# Patient Record
Sex: Male | Born: 1980 | Hispanic: No | Marital: Single
Health system: Southern US, Community
[De-identification: ages and names within clinical notes are randomized; demographics above are authoritative.]

## PROBLEM LIST (undated history)

## (undated) ENCOUNTER — Ambulatory Visit (HOSPITAL_COMMUNITY): Admission: EM | Payer: Medicaid Other | Source: Home / Self Care

---

## 2002-04-03 ENCOUNTER — Emergency Department (HOSPITAL_COMMUNITY): Admission: EM | Admit: 2002-04-03 | Discharge: 2002-04-03 | Payer: Self-pay | Admitting: Emergency Medicine

## 2003-04-09 ENCOUNTER — Emergency Department (HOSPITAL_COMMUNITY): Admission: EM | Admit: 2003-04-09 | Discharge: 2003-04-09 | Payer: Self-pay | Admitting: Emergency Medicine

## 2003-12-28 ENCOUNTER — Emergency Department (HOSPITAL_COMMUNITY): Admission: EM | Admit: 2003-12-28 | Discharge: 2003-12-28 | Payer: Self-pay | Admitting: Emergency Medicine

## 2005-05-31 ENCOUNTER — Emergency Department (HOSPITAL_COMMUNITY): Admission: EM | Admit: 2005-05-31 | Discharge: 2005-05-31 | Payer: Self-pay | Admitting: Emergency Medicine

## 2011-07-14 ENCOUNTER — Emergency Department (HOSPITAL_COMMUNITY)
Admission: EM | Admit: 2011-07-14 | Discharge: 2011-07-14 | Disposition: A | Discharge status: Home / Self Care | Payer: Self-pay | Attending: Emergency Medicine | Admitting: Emergency Medicine

## 2011-07-14 ENCOUNTER — Emergency Department (HOSPITAL_COMMUNITY): Payer: Self-pay

## 2011-07-14 ENCOUNTER — Encounter (HOSPITAL_COMMUNITY): Payer: Self-pay

## 2011-07-14 DIAGNOSIS — R1031 Right lower quadrant pain: Secondary | ICD-10-CM | POA: Insufficient documentation

## 2011-07-14 LAB — POCT I-STAT, CHEM 8
BUN: 14 mg/dL (ref 6–23)
Chloride: 105 mEq/L (ref 96–112)
HCT: 48 % (ref 39.0–52.0)
Potassium: 4.3 mEq/L (ref 3.5–5.1)
Sodium: 140 mEq/L (ref 135–145)

## 2011-07-14 LAB — URINALYSIS, ROUTINE W REFLEX MICROSCOPIC
Bilirubin Urine: NEGATIVE
Hgb urine dipstick: NEGATIVE
Nitrite: NEGATIVE
Specific Gravity, Urine: 1.008 (ref 1.005–1.030)
Urobilinogen, UA: 0.2 mg/dL (ref 0.0–1.0)
pH: 6 (ref 5.0–8.0)

## 2011-07-14 LAB — CBC
HCT: 43.6 % (ref 39.0–52.0)
MCV: 75.2 fL — ABNORMAL LOW (ref 78.0–100.0)
RBC: 5.8 MIL/uL (ref 4.22–5.81)
WBC: 7.3 10*3/uL (ref 4.0–10.5)

## 2011-07-14 MED ORDER — IOHEXOL 300 MG/ML  SOLN
100.0000 mL | Freq: Once | INTRAMUSCULAR | Status: AC | PRN
  Administered 2011-07-14: 100 mL via INTRAVENOUS

## 2012-11-24 IMAGING — CT CT ABD-PELV W/ CM
2 of 4 series · 17 of 46 positions shown, 19 images · IV contrast (water/omni  & 100ml omni 300)
Comparison: None.

CLINICAL DATA: Right upper quadrant abdominal pain.

CT ABDOMEN AND PELVIS WITH CONTRAST
TECHNIQUE: Multidetector CT imaging of the abdomen and pelvis was
performed following the standard protocol during bolus
administration of intravenous contrast.
Contrast: 100mL OMNIPAQUE IOHEXOL 300 MG/ML IV SOLN

[Series 2: routine abdomen · axial · 0.81mm/px · z∈[-538,-158]mm · 14 of 84 slices shown, 16 images]
[im 4/84  soft-tissue]
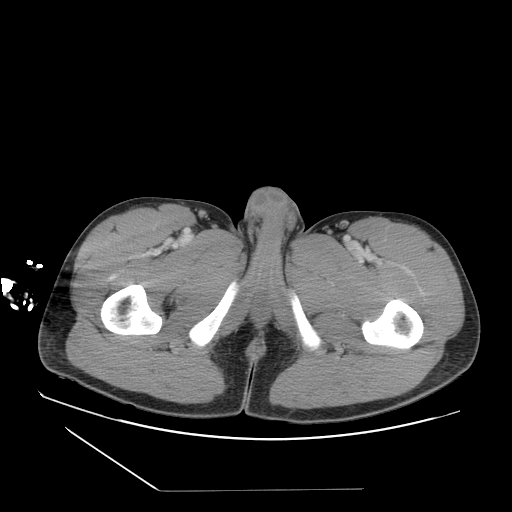
[im 4/84  bone]
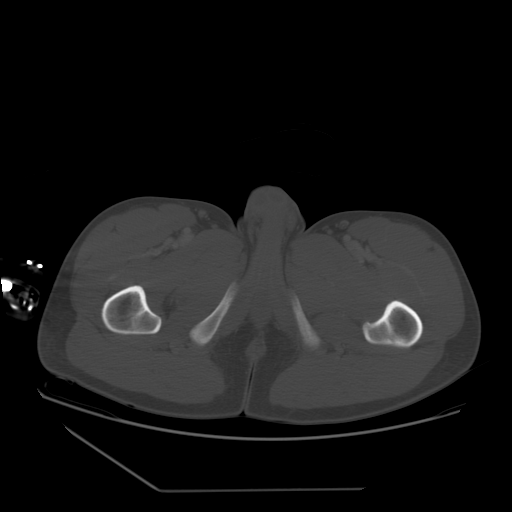
[im 11/84  soft-tissue]
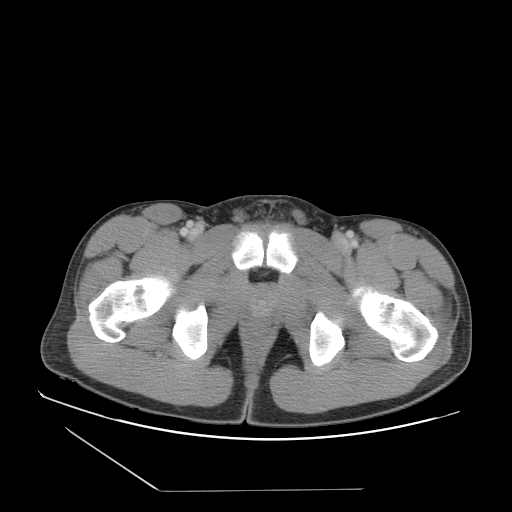
[im 18/84  soft-tissue]
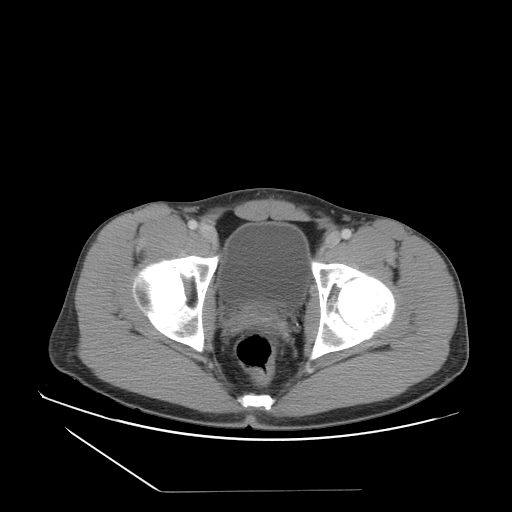
[im 21/84  soft-tissue]
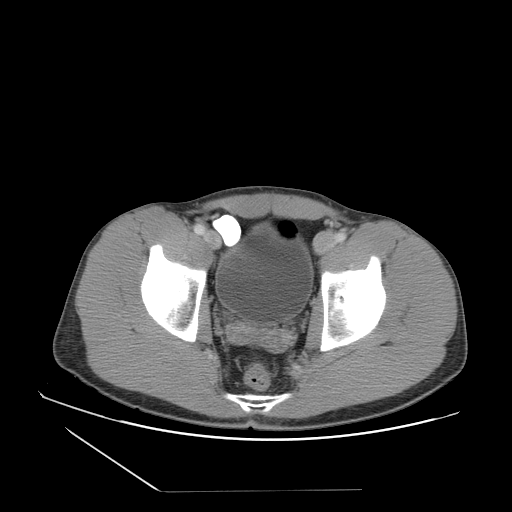
[im 28/84  soft-tissue]
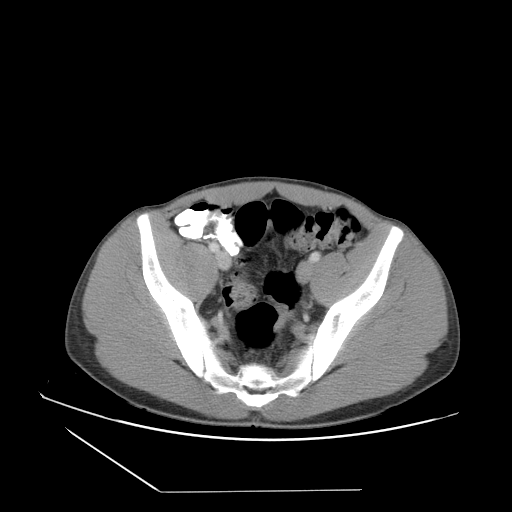
[im 35/84  soft-tissue]
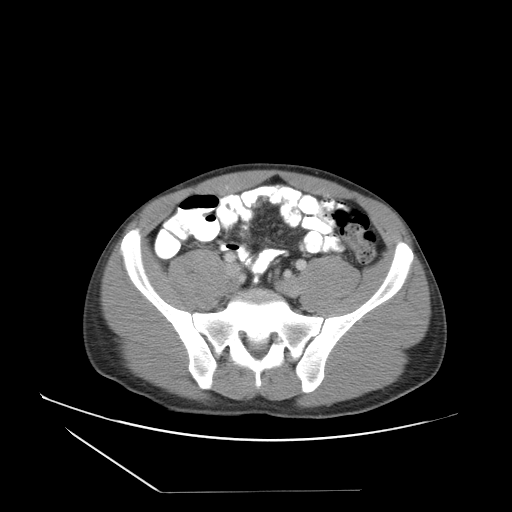
[im 39/84  soft-tissue]
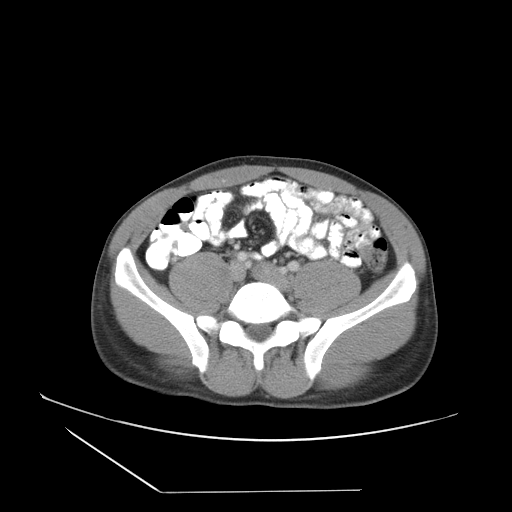
[im 45/84  soft-tissue]
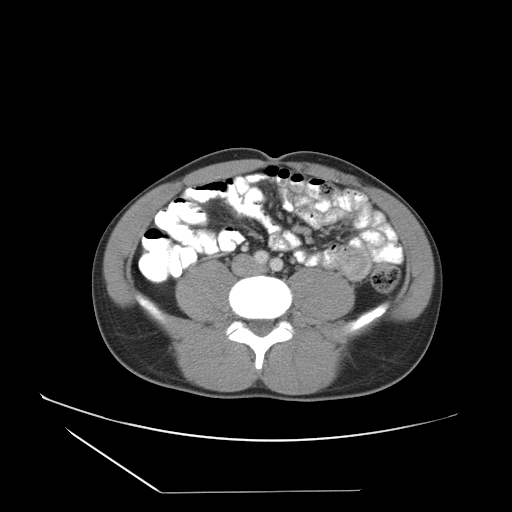
[im 49/84  soft-tissue]
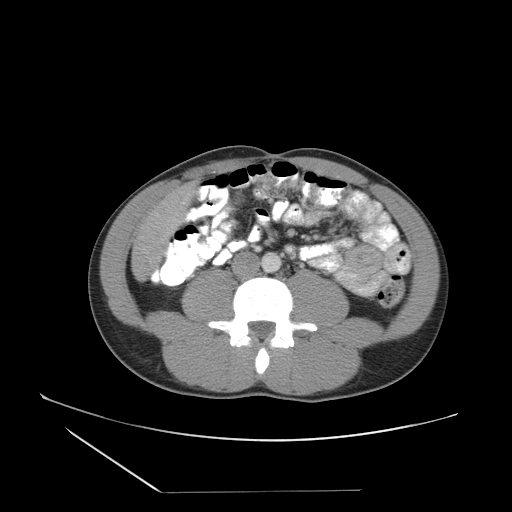
[im 49/84  bone]
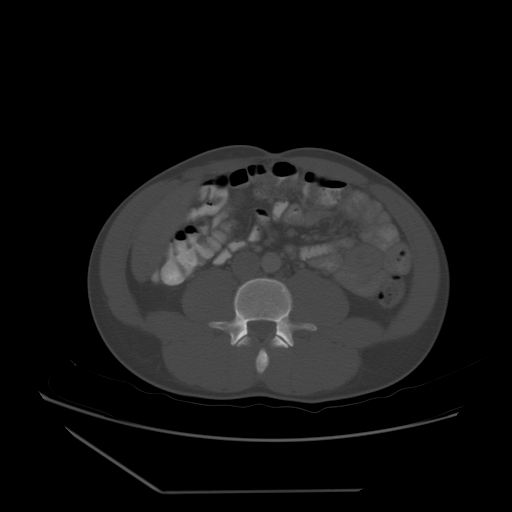
[im 56/84  soft-tissue]
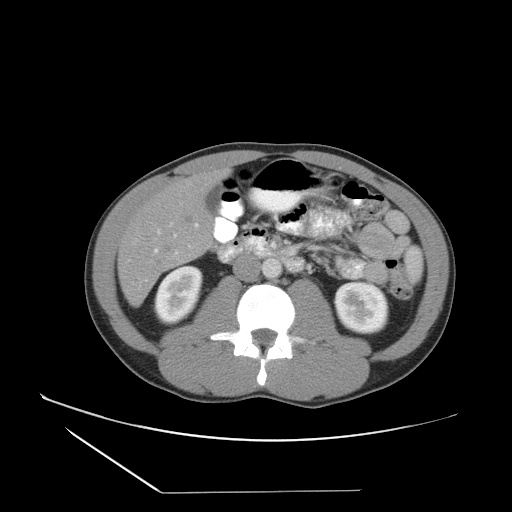
[im 63/84  soft-tissue]
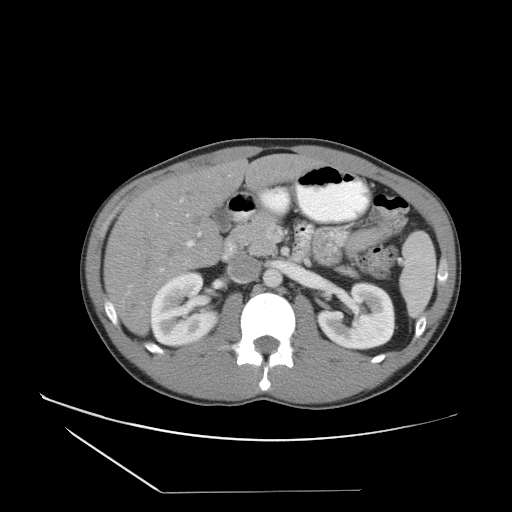
[im 66/84  soft-tissue]
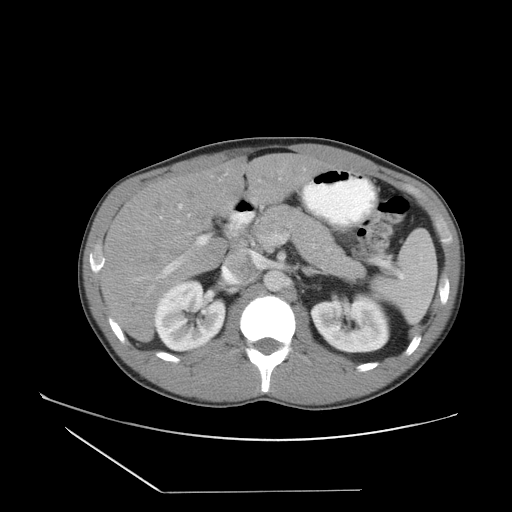
[im 73/84  soft-tissue]
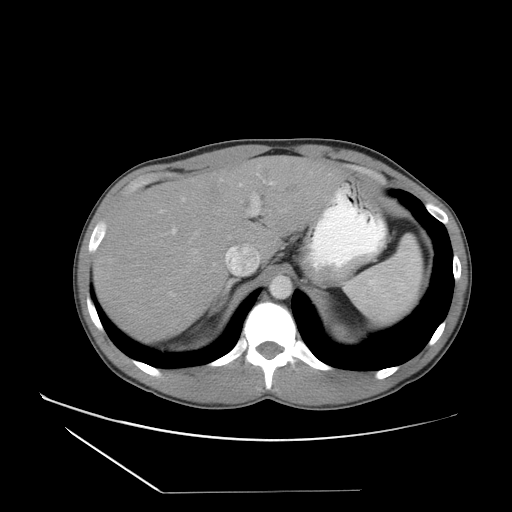
[im 80/84  soft-tissue]
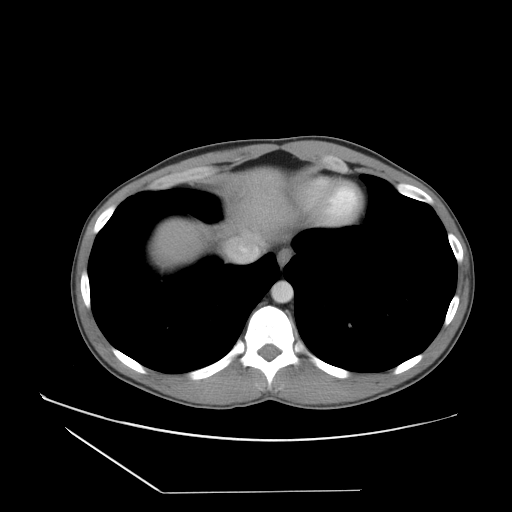

[Series 401: coronal · coronal · 0.83mm/px · 3 of 79 slices shown]
[im 27/79  soft-tissue]
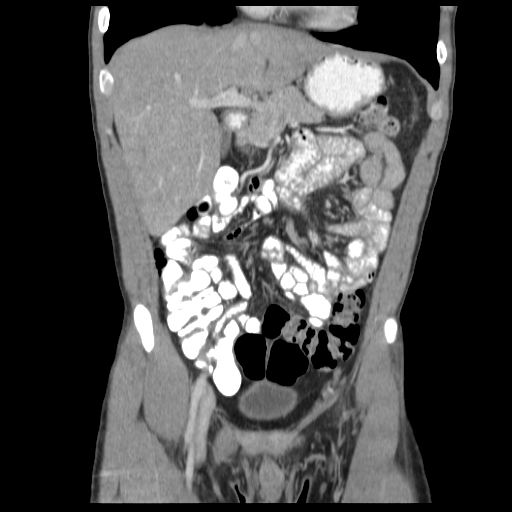
[im 35/79  soft-tissue]
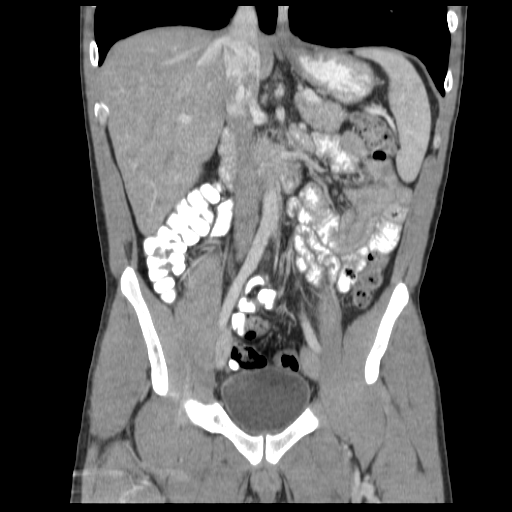
[im 44/79  soft-tissue]
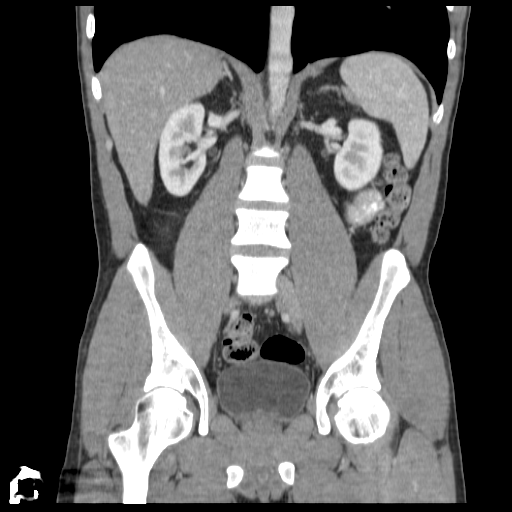

[17 of 46 positions shown; findings below may reference images not displayed]

FINDINGS: Lung bases are clear.  No pleural or pericardial
effusion.

The gallbladder, liver, spleen, adrenal glands, pancreas and
kidneys all appear normal.  The appendix is well visualized and
normal in appearance.  There is no lymphadenopathy or fluid.
Urinary bladder appears normal.  The stomach and small and large
bowel normal in appearance.  There is no focal bony abnormality.
IMPRESSION: Negative for appendicitis.  Normal study.

## 2019-03-24 ENCOUNTER — Other Ambulatory Visit: Payer: Self-pay

## 2019-03-24 ENCOUNTER — Emergency Department (HOSPITAL_COMMUNITY): Payer: Self-pay

## 2019-03-24 ENCOUNTER — Emergency Department (HOSPITAL_COMMUNITY)
Admission: EM | Admit: 2019-03-24 | Discharge: 2019-03-25 | Disposition: A | Discharge status: Home / Self Care | Payer: Self-pay | Attending: Emergency Medicine | Admitting: Emergency Medicine

## 2019-03-24 DIAGNOSIS — M79641 Pain in right hand: Secondary | ICD-10-CM | POA: Insufficient documentation

## 2019-03-24 DIAGNOSIS — Z20828 Contact with and (suspected) exposure to other viral communicable diseases: Secondary | ICD-10-CM | POA: Insufficient documentation

## 2019-03-24 DIAGNOSIS — M79642 Pain in left hand: Secondary | ICD-10-CM | POA: Insufficient documentation

## 2019-03-24 DIAGNOSIS — R197 Diarrhea, unspecified: Secondary | ICD-10-CM | POA: Insufficient documentation

## 2019-03-24 LAB — CBC WITH DIFFERENTIAL/PLATELET
Abs Immature Granulocytes: 0.02 10*3/uL (ref 0.00–0.07)
Basophils Absolute: 0.1 10*3/uL (ref 0.0–0.1)
Basophils Relative: 1 %
Eosinophils Absolute: 0.1 10*3/uL (ref 0.0–0.5)
Eosinophils Relative: 1 %
HCT: 46.1 % (ref 39.0–52.0)
Hemoglobin: 14.4 g/dL (ref 13.0–17.0)
Immature Granulocytes: 0 %
Lymphocytes Relative: 23 %
Lymphs Abs: 1.8 10*3/uL (ref 0.7–4.0)
MCH: 24.3 pg — ABNORMAL LOW (ref 26.0–34.0)
MCHC: 31.2 g/dL (ref 30.0–36.0)
MCV: 77.7 fL — ABNORMAL LOW (ref 80.0–100.0)
Monocytes Absolute: 0.8 10*3/uL (ref 0.1–1.0)
Monocytes Relative: 10 %
Neutro Abs: 5.1 10*3/uL (ref 1.7–7.7)
Neutrophils Relative %: 65 %
Platelets: 209 10*3/uL (ref 150–400)
RBC: 5.93 MIL/uL — ABNORMAL HIGH (ref 4.22–5.81)
RDW: 14 % (ref 11.5–15.5)
WBC: 7.9 10*3/uL (ref 4.0–10.5)
nRBC: 0 % (ref 0.0–0.2)

## 2019-03-24 LAB — COMPREHENSIVE METABOLIC PANEL
ALT: 22 U/L (ref 0–44)
AST: 42 U/L — ABNORMAL HIGH (ref 15–41)
Albumin: 3.9 g/dL (ref 3.5–5.0)
Alkaline Phosphatase: 61 U/L (ref 38–126)
Anion gap: 11 (ref 5–15)
BUN: 12 mg/dL (ref 6–20)
CO2: 22 mmol/L (ref 22–32)
Calcium: 9.1 mg/dL (ref 8.9–10.3)
Chloride: 104 mmol/L (ref 98–111)
Creatinine, Ser: 1.05 mg/dL (ref 0.61–1.24)
GFR calc Af Amer: >60 mL/min (ref >60)
GFR calc non Af Amer: >60 mL/min (ref >60)
Glucose, Bld: 86 mg/dL (ref 70–99)
Potassium: 4.2 mmol/L (ref 3.5–5.1)
Sodium: 137 mmol/L (ref 135–145)
Total Bilirubin: 0.7 mg/dL (ref 0.3–1.2)
Total Protein: 7.2 g/dL (ref 6.5–8.1)

## 2019-03-24 LAB — LIPASE, BLOOD: Lipase: 27 U/L (ref 11–51)

## 2019-03-24 MED ORDER — LOPERAMIDE HCL 2 MG PO CAPS: 2.0000 mg | ORAL_CAPSULE | Freq: Four times a day (QID) | ORAL | 0 refills | Status: DC | PRN

## 2019-03-24 MED ORDER — METHOCARBAMOL 500 MG PO TABS: 500.0000 mg | ORAL_TABLET | Freq: Two times a day (BID) | ORAL | 0 refills | Status: DC

## 2019-03-24 MED ORDER — IBUPROFEN 600 MG PO TABS: 600.0000 mg | ORAL_TABLET | Freq: Four times a day (QID) | ORAL | 0 refills | Status: DC | PRN

## 2019-03-24 NOTE — ED Triage Notes (Signed)
Pt states he needs to be tested for COVID-19 prior to returning to work. Denies cough or fever but endorses diarrhea. Pt also complaining of chronic hand pain in both hands. Alert and oriented at time of triage.

## 2019-03-24 NOTE — Discharge Instructions (Addendum)
Take Imodium up to 4 times daily as needed for diarrhea.  Take Robaxin twice daily as needed for muscle pain or spasms.  Take ibuprofen every 6 hours as needed for your hand pain.  Wear the wrist splints at night or when using her hands, as needed.  Please follow-up with Dr. Lenon Curt for further evaluation and treatment of your hand pain.  Please return to emergency department if you develop any new or worsening symptoms.  You will be called in 3 days with your COVID-19 results.  If negative, you can return.  If positive, you will need to isolated home for 10 days.

## 2019-03-24 NOTE — ED Provider Notes (Signed)
[facility] EM[facility]ENCY [facility] Provider Note   CSN: [id number] Arrival date & time: 03/24/19  1903    History   Chief Complaint Chief Complaint  Patient presents with  . Hand Pain    HPI Patrick Baker is a 38 y.o. male who is previously healthy who presents with multiple complaints.  First, patient reports vomiting and diarrhea for the past 3 3 weeks.  He reports having multiple episodes of diarrhea daily and one episode of vomiting daily, mostly when he is brushing his teeth.  He reports some intermittent shortness of breath.  He is concerned he may have been exposed to COVID-19, however he does not know of a specific contact.  He denies any cough or fever.  Patient also having bilateral hand pain, worse on the right that has been present for the past several months.  He reports numbness and tingling in his third through fifth fingers.  He has pain shooting up his arm and is having pain in his right upper shoulder and right side of his neck.  He denies any specific injury.  He works with his arms and hands a lot.  He has not taken any medication for symptoms.     HPI  No past medical history on file.  There are no active problems to display for this patient.      Home Medications    Prior to Admission medications   Medication Sig Start Date End Date Taking? Authorizing Provider  ibuprofen (ADVIL) 600 MG tablet Take 1 tablet (600 mg total) by mouth every 6 (six) hours as needed. 03/24/19   Aara Jacquot, Waylan BogaAlexandra M, PA-C  loperamide (IMODIUM) 2 MG capsule Take 1 capsule (2 mg total) by mouth 4 (four) times daily as needed for diarrhea or loose stools. 03/24/19   Elanda Garmany, Waylan BogaAlexandra M, PA-C  methocarbamol (ROBAXIN) 500 MG tablet Take 1 tablet (500 mg total) by mouth 2 (two) times daily. 03/24/19   Emi HolesLaw, Louiza Moor M, PA-C    Family History No family history on file.  Social History Social History   Tobacco Use  . Smoking status: Not on file  Substance Use Topics  .  Alcohol use: Not on file  . Drug use: Not on file     Allergies   Patient has no known allergies.   Review of Systems Review of Systems  Constitutional: Negative for fever.  Respiratory: Positive for shortness of breath. Negative for cough.   Gastrointestinal: Positive for diarrhea and vomiting.  Musculoskeletal: Positive for arthralgias.     Physical Exam Updated Vital Signs BP (!) 142/96 (BP Location: Right Arm)   Pulse 65   Temp 98.8 F (37.1 C) (Oral)   Resp 14   Ht 5\' 10"  (1.778 m)   Wt 65.8 kg   SpO2 98%   BMI 20.81 kg/m   Physical Exam Vitals signs and nursing note reviewed.  Constitutional:      General: He is not in acute distress.    Appearance: He is well-developed. He is not diaphoretic.  HENT:     Head: Normocephalic and atraumatic.     Mouth/Throat:     Pharynx: No oropharyngeal exudate.  Eyes:     General: No scleral icterus.       Right eye: No discharge.        Left eye: No discharge.     Conjunctiva/sclera: Conjunctivae normal.     Pupils: Pupils are equal, round, and reactive to light.  Neck:  Musculoskeletal: Normal range of motion and neck supple. Muscular tenderness present. No spinous process tenderness.     Thyroid: No thyromegaly.   Cardiovascular:     Rate and Rhythm: Normal rate and regular rhythm.     Heart sounds: Normal heart sounds. No murmur. No friction rub. No gallop.   Pulmonary:     Effort: Pulmonary effort is normal. No respiratory distress.     Breath sounds: Normal breath sounds. No stridor. No wheezing or rales.  Abdominal:     General: Bowel sounds are normal. There is no distension.     Palpations: Abdomen is soft.     Tenderness: There is no abdominal tenderness. There is no guarding or rebound.  Musculoskeletal:       Hands:     Comments: Symptoms reproduced with reverse Phalen's  Lymphadenopathy:     Cervical: No cervical adenopathy.  Skin:    General: Skin is warm and dry.     Coloration: Skin is not  pale.     Findings: No rash.  Neurological:     Mental Status: He is alert.     Coordination: Coordination normal.     Comments: Normal sensation throughout upper extremities, equal bilateral grip strength, 5/5 strength to bilateral upper extremities      ED Treatments / Results  Labs (all labs ordered are listed, but only abnormal results are displayed) Labs Reviewed  COMPREHENSIVE METABOLIC PANEL - Abnormal; Notable for the following components:      Result Value   AST 42 (*)    All other components within normal limits  CBC WITH DIFFERENTIAL/PLATELET - Abnormal; Notable for the following components:   RBC 5.93 (*)    MCV 77.7 (*)    MCH 24.3 (*)    All other components within normal limits  NOVEL CORONAVIRUS, NAA (HOSPITAL ORDER, SEND-OUT TO REF LAB)  LIPASE, BLOOD    EKG None  Radiology Dg Wrist Complete Right  Result Date: 03/24/2019 CLINICAL DATA:  Pain EXAM: RIGHT WRIST - COMPLETE 3+ VIEW COMPARISON:  None. FINDINGS: There is no evidence of fracture or dislocation. There is no evidence of arthropathy or other focal bone abnormality. Soft tissues are unremarkable. There is a small chronic appearing osseous fragment adjacent to the first carpometacarpal joint. IMPRESSION: No acute osseous abnormality. Electronically Signed   By: Katherine Mantlehristopher  Green M.D.   On: 03/24/2019 20:35   Dg Chest Portable 1 View  Result Date: 03/24/2019 CLINICAL DATA:  Shortness of breath EXAM: PORTABLE CHEST 1 VIEW COMPARISON:  None. FINDINGS: The heart size and mediastinal contours are within normal limits. Both lungs are clear. The visualized skeletal structures are unremarkable. IMPRESSION: No active disease. Electronically Signed   By: Katherine Mantlehristopher  Green M.D.   On: 03/24/2019 20:33   Dg Hand Complete Right  Result Date: 03/24/2019 CLINICAL DATA:  Chronic right hand pain EXAM: RIGHT HAND - COMPLETE 3+ VIEW COMPARISON:  None. FINDINGS: There is no acute displaced fracture or dislocation. There  is a small osseous fragment adjacent to the first carpometacarpal joint which may represent sequela of an old remote injury. There is no radiopaque foreign body. No significant surrounding soft tissue swelling. There may be some mild degenerative changes of the distal interphalangeal joint of the second digit. IMPRESSION: No acute osseous abnormality. Electronically Signed   By: Katherine Mantlehristopher  Green M.D.   On: 03/24/2019 20:34    Procedures Procedures (including critical care time)  Medications Ordered in ED Medications - No data to display  Initial Impression / Assessment and Plan / ED Course  I have reviewed the triage vital signs and the nursing notes.  Pertinent labs & imaging results that were available during my care of the patient were reviewed by me and considered in my medical decision making (see chart for details).        Patient presenting with multiple complaints including bilateral chronic hand pain and reported several weeks of diarrhea.  However, patient's labs are very reassuring.  His electrolytes are all within normal limits.  He does not look dehydrated at all.  His vitals are stable.  Send out COVID test sent.  Chest x-ray is negative.  Patient reports shortness of breath, however he is saturating well and is in no distress.  He does not appear tachypneic whatsoever.  Will discharge home with Imodium for diarrhea.  Patient may have some type of overuse injury, possibly carpal tunnel, however he is having some pain in his ring and pinky, which is not consistent.  He may have another nerve entrapment.  He does have some right upper trapezius tenderness and tightness.  Will give Robaxin and ibuprofen.  Heat discussed.  Follow-up to hand surgeon for further evaluation.  Return precautions discussed.  Patient understands and agrees with plan.  Patient vital stable throughout ED course and discharged in satisfactory condition.  Final Clinical Impressions(s) / ED Diagnoses   Final  diagnoses:  Bilateral hand pain  Diarrhea, unspecified type    ED Discharge Orders         Ordered    loperamide (IMODIUM) 2 MG capsule  4 times daily PRN     03/24/19 2246    methocarbamol (ROBAXIN) 500 MG tablet  2 times daily     03/24/19 2246    ibuprofen (ADVIL) 600 MG tablet  Every 6 hours PRN     03/24/19 2246           LawBea Graff, PA-C 03/24/19 2318    Lennice Sites, DO 03/24/19 2352

## 2019-03-25 NOTE — ED Notes (Signed)
Wrist brace applied to pt prior to d/c.

## 2019-03-25 NOTE — ED Notes (Signed)
Patient Alert and oriented to baseline. Stable and ambulatory to baseline. Patient verbalized understanding of the discharge instructions.  Patient belongings were taken by the patient.   

## 2019-03-26 LAB — NOVEL CORONAVIRUS, NAA (HOSP ORDER, SEND-OUT TO REF LAB; TAT 18-24 HRS): SARS-CoV-2, NAA: NOT DETECTED

## 2019-05-20 ENCOUNTER — Telehealth: Payer: Self-pay | Admitting: General Practice

## 2019-05-20 NOTE — Telephone Encounter (Signed)
Negative COVID results given. Patient results "NOT Detected." Caller expressed understanding. ° °

## 2020-08-04 IMAGING — DX RIGHT WRIST - COMPLETE 3+ VIEW
4 series · 4 of 4 positions shown · non-contrast
Comparison: None.

CLINICAL DATA: Pain

EXAM:
RIGHT WRIST - COMPLETE 3+ VIEW

[wrist ap (1 of 2)]
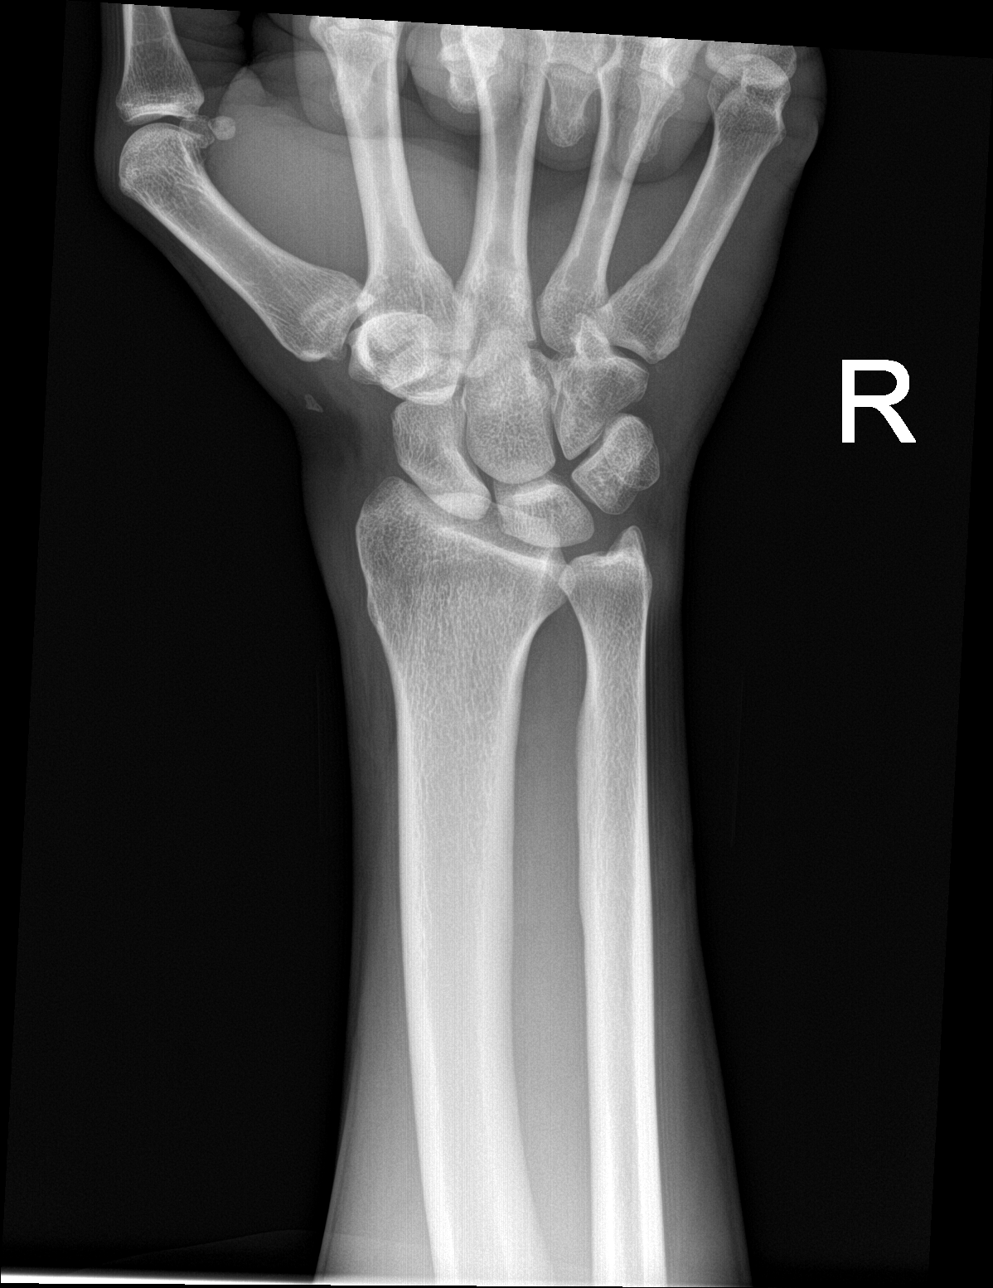

[wrist obl]
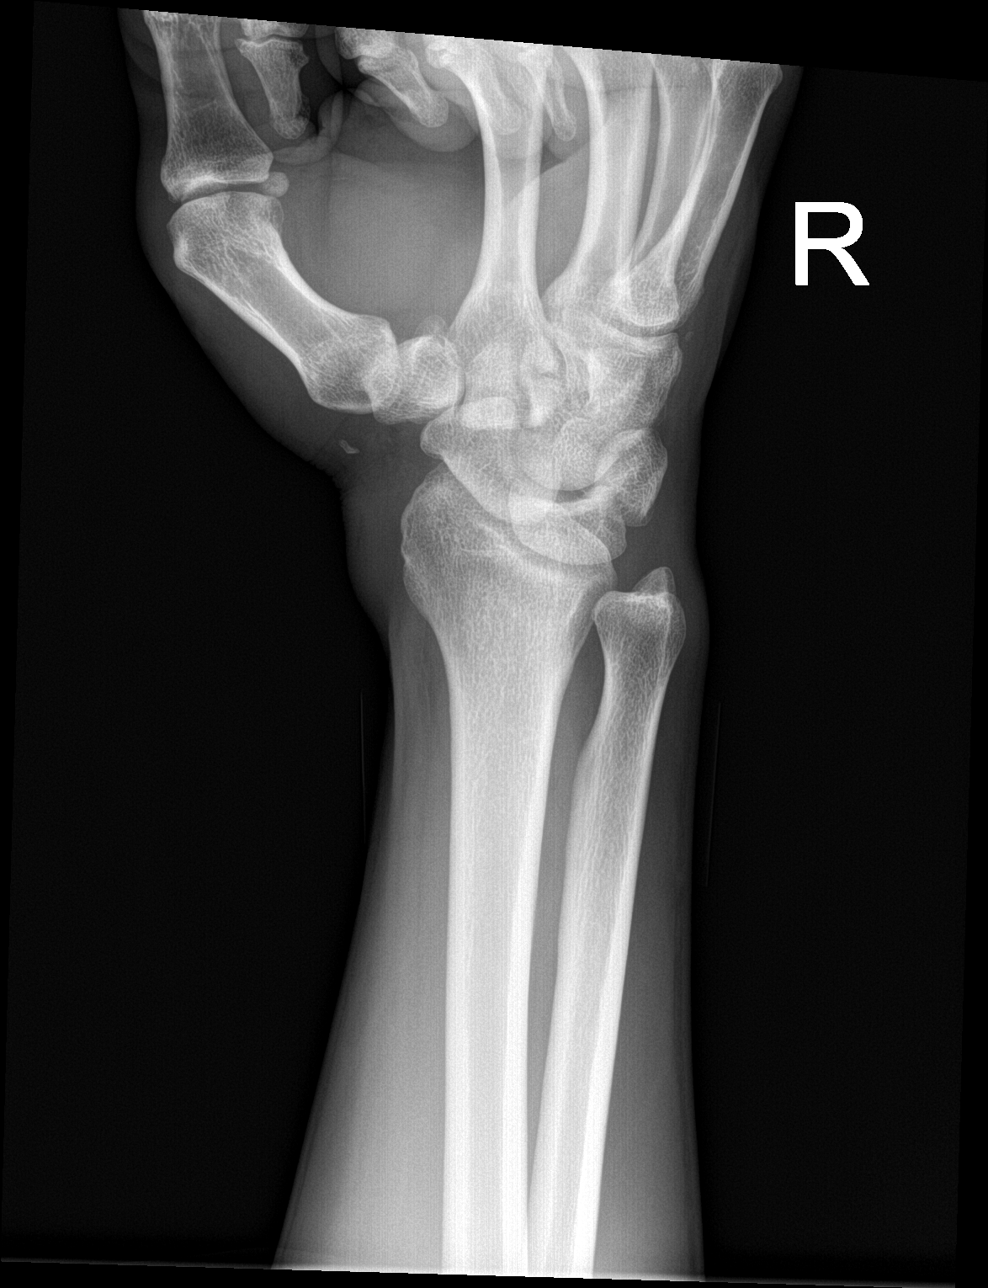

[wrist lat]
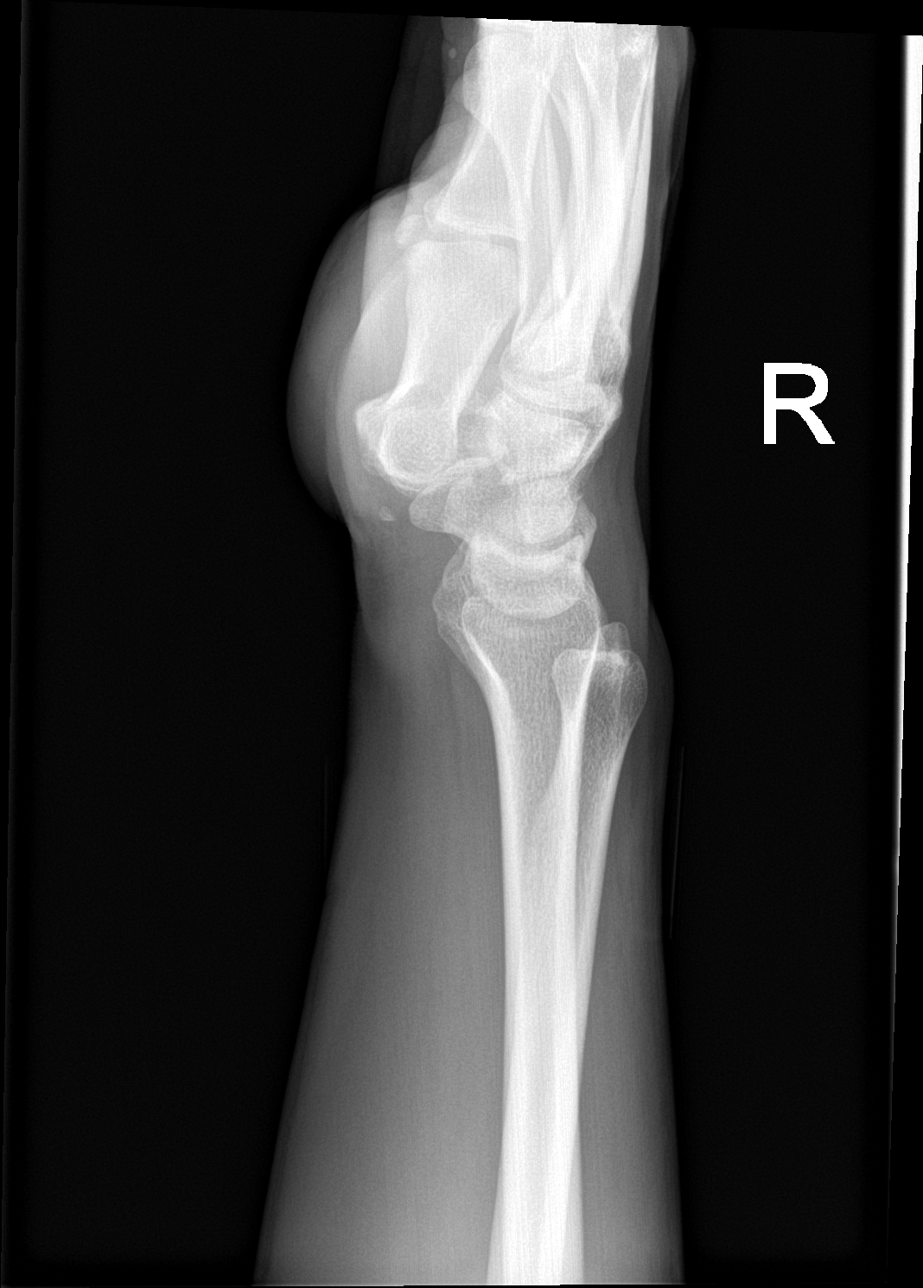

[wrist ap (2 of 2)]
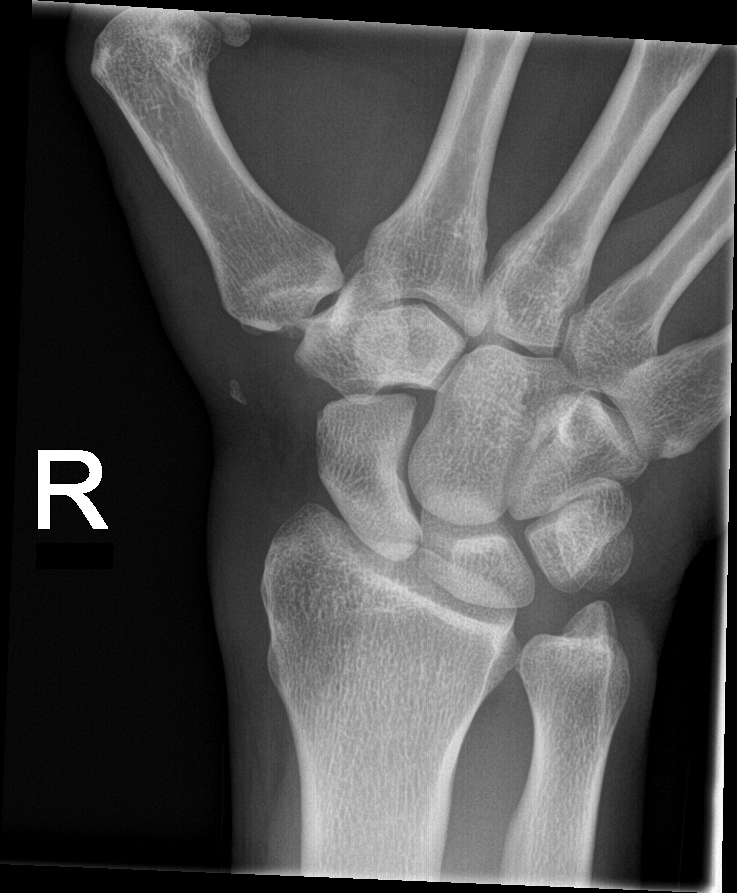

[4 of 4 positions shown; findings below may reference images not displayed]

FINDINGS: There is no evidence of fracture or dislocation. There is no
evidence of arthropathy or other focal bone abnormality. Soft
tissues are unremarkable. There is a small chronic appearing osseous
fragment adjacent to the first carpometacarpal joint.
IMPRESSION: No acute osseous abnormality.

## 2022-01-27 ENCOUNTER — Other Ambulatory Visit: Payer: Self-pay

## 2022-01-27 ENCOUNTER — Ambulatory Visit (HOSPITAL_COMMUNITY)
Admission: EM | Admit: 2022-01-27 | Discharge: 2022-01-30 | Disposition: A | Discharge status: Home / Self Care | Payer: Medicaid Other | Attending: Psychiatry | Admitting: Psychiatry

## 2022-01-27 ENCOUNTER — Encounter (HOSPITAL_COMMUNITY): Payer: Self-pay | Admitting: Emergency Medicine

## 2022-01-27 DIAGNOSIS — F22 Delusional disorders: Secondary | ICD-10-CM | POA: Insufficient documentation

## 2022-01-27 DIAGNOSIS — Z20822 Contact with and (suspected) exposure to covid-19: Secondary | ICD-10-CM | POA: Insufficient documentation

## 2022-01-27 DIAGNOSIS — F1911 Other psychoactive substance abuse, in remission: Secondary | ICD-10-CM | POA: Insufficient documentation

## 2022-01-27 LAB — CBC WITH DIFFERENTIAL/PLATELET
Abs Immature Granulocytes: 0.03 10*3/uL (ref 0.00–0.07)
Basophils Absolute: 0.1 10*3/uL (ref 0.0–0.1)
Basophils Relative: 1 %
Eosinophils Absolute: 0 10*3/uL (ref 0.0–0.5)
Eosinophils Relative: 0 %
HCT: 43.8 % (ref 39.0–52.0)
Hemoglobin: 13.8 g/dL (ref 13.0–17.0)
Immature Granulocytes: 0 %
Lymphocytes Relative: 13 %
Lymphs Abs: 1.3 10*3/uL (ref 0.7–4.0)
MCH: 23.5 pg — ABNORMAL LOW (ref 26.0–34.0)
MCHC: 31.5 g/dL (ref 30.0–36.0)
MCV: 74.6 fL — ABNORMAL LOW (ref 80.0–100.0)
Monocytes Absolute: 0.9 10*3/uL (ref 0.1–1.0)
Monocytes Relative: 9 %
Neutro Abs: 8 10*3/uL — ABNORMAL HIGH (ref 1.7–7.7)
Neutrophils Relative %: 77 %
Platelets: 326 10*3/uL (ref 150–400)
RBC: 5.87 MIL/uL — ABNORMAL HIGH (ref 4.22–5.81)
RDW: 14.4 % (ref 11.5–15.5)
WBC: 10.2 10*3/uL (ref 4.0–10.5)
nRBC: 0 % (ref 0.0–0.2)

## 2022-01-27 LAB — URINALYSIS, ROUTINE W REFLEX MICROSCOPIC
Bilirubin Urine: NEGATIVE
Glucose, UA: NEGATIVE mg/dL
Hgb urine dipstick: NEGATIVE
Ketones, ur: NEGATIVE mg/dL
Nitrite: NEGATIVE
Protein, ur: NEGATIVE mg/dL
Specific Gravity, Urine: 1.017 (ref 1.005–1.030)
pH: 5 (ref 5.0–8.0)

## 2022-01-27 LAB — POCT URINE DRUG SCREEN - MANUAL ENTRY (I-SCREEN)
POC Amphetamine UR: POSITIVE — AB
POC Buprenorphine (BUP): NOT DETECTED
POC Cocaine UR: NOT DETECTED
POC Marijuana UR: POSITIVE — AB
POC Methadone UR: NOT DETECTED
POC Methamphetamine UR: POSITIVE — AB
POC Morphine: NOT DETECTED
POC Oxazepam (BZO): NOT DETECTED
POC Oxycodone UR: NOT DETECTED
POC Secobarbital (BAR): NOT DETECTED

## 2022-01-27 LAB — RESP PANEL BY RT-PCR (FLU A&B, COVID) ARPGX2
Influenza A by PCR: NEGATIVE
Influenza B by PCR: NEGATIVE
SARS Coronavirus 2 by RT PCR: NEGATIVE

## 2022-01-27 LAB — COMPREHENSIVE METABOLIC PANEL
ALT: 18 U/L (ref 0–44)
AST: 28 U/L (ref 15–41)
Albumin: 4.9 g/dL (ref 3.5–5.0)
Alkaline Phosphatase: 57 U/L (ref 38–126)
Anion gap: 13 (ref 5–15)
BUN: 18 mg/dL (ref 6–20)
CO2: 23 mmol/L (ref 22–32)
Calcium: 10.2 mg/dL (ref 8.9–10.3)
Chloride: 103 mmol/L (ref 98–111)
Creatinine, Ser: 1.9 mg/dL — ABNORMAL HIGH (ref 0.61–1.24)
GFR, Estimated: 45 mL/min — ABNORMAL LOW (ref >60)
Glucose, Bld: 78 mg/dL (ref 70–99)
Potassium: 4.6 mmol/L (ref 3.5–5.1)
Sodium: 139 mmol/L (ref 135–145)
Total Bilirubin: 0.9 mg/dL (ref 0.3–1.2)
Total Protein: 7.8 g/dL (ref 6.5–8.1)

## 2022-01-27 LAB — LIPID PANEL
Cholesterol: 172 mg/dL (ref 0–200)
HDL: 63 mg/dL (ref >40)
LDL Cholesterol: 97 mg/dL (ref 0–99)
Total CHOL/HDL Ratio: 2.7 RATIO
Triglycerides: 61 mg/dL (ref <150)
VLDL: 12 mg/dL (ref 0–40)

## 2022-01-27 LAB — ETHANOL: Alcohol, Ethyl (B): <10 mg/dL (ref <10)

## 2022-01-27 LAB — HEMOGLOBIN A1C
Hgb A1c MFr Bld: 5.7 % — ABNORMAL HIGH (ref 4.8–5.6)
Mean Plasma Glucose: 116.89 mg/dL

## 2022-01-27 LAB — TSH: TSH: 4.657 u[IU]/mL — ABNORMAL HIGH (ref 0.350–4.500)

## 2022-01-27 MED ORDER — OLANZAPINE 5 MG PO TBDP
5.0000 mg | ORAL_TABLET | Freq: Once | ORAL | Status: AC
  Administered 2022-01-27: 5 mg via ORAL
  Filled 2022-01-27: qty 1

## 2022-01-27 MED ORDER — OLANZAPINE 10 MG PO TABS
10.0000 mg | ORAL_TABLET | Freq: Once | ORAL | Status: AC | PRN
  Administered 2022-01-27: 10 mg via ORAL
  Filled 2022-01-27: qty 1

## 2022-01-27 MED ORDER — HYDROXYZINE HCL 25 MG PO TABS
25.0000 mg | ORAL_TABLET | Freq: Three times a day (TID) | ORAL | Status: DC | PRN
Start: 2022-01-27 — End: 2022-01-30
  Administered 2022-01-28: 25 mg via ORAL
  Filled 2022-01-27: qty 1

## 2022-01-27 MED ORDER — OLANZAPINE 5 MG PO TBDP: 5.0000 mg | ORAL_TABLET | Freq: Three times a day (TID) | ORAL | Status: DC | PRN

## 2022-01-27 MED ORDER — LORAZEPAM 1 MG PO TABS
1.0000 mg | ORAL_TABLET | Freq: Once | ORAL | Status: AC | PRN
  Administered 2022-01-27: 1 mg via ORAL
  Filled 2022-01-27: qty 1

## 2022-01-27 MED ORDER — LORAZEPAM 1 MG PO TABS: 1.0000 mg | ORAL_TABLET | Freq: Once | ORAL | Status: DC

## 2022-01-27 MED ORDER — ZIPRASIDONE MESYLATE 20 MG IM SOLR
20.0000 mg | INTRAMUSCULAR | Status: AC | PRN
  Administered 2022-01-28: 20 mg via INTRAMUSCULAR
  Filled 2022-01-27: qty 20

## 2022-01-27 MED ORDER — LORAZEPAM 1 MG PO TABS
1.0000 mg | ORAL_TABLET | Freq: Once | ORAL | Status: AC
  Administered 2022-01-27: 1 mg via ORAL
  Filled 2022-01-27: qty 1

## 2022-01-27 MED ORDER — ACETAMINOPHEN 325 MG PO TABS: 650.0000 mg | ORAL_TABLET | Freq: Four times a day (QID) | ORAL | Status: DC | PRN

## 2022-01-27 MED ORDER — ALUM & MAG HYDROXIDE-SIMETH 200-200-20 MG/5ML PO SUSP: 30.0000 mL | ORAL | Status: DC | PRN

## 2022-01-27 MED ORDER — MAGNESIUM HYDROXIDE 400 MG/5ML PO SUSP: 30.0000 mL | Freq: Every day | ORAL | Status: DC | PRN

## 2022-01-27 MED ORDER — TRAZODONE HCL 50 MG PO TABS
50.0000 mg | ORAL_TABLET | Freq: Every evening | ORAL | Status: DC | PRN
  Filled 2022-01-27: qty 1

## 2022-01-27 MED ORDER — LORAZEPAM 1 MG PO TABS
1.0000 mg | ORAL_TABLET | ORAL | Status: DC | PRN
  Filled 2022-01-27: qty 1

## 2022-01-27 MED ORDER — OLANZAPINE 5 MG PO TBDP
5.0000 mg | ORAL_TABLET | Freq: Every day | ORAL | Status: DC
  Filled 2022-01-27: qty 1

## 2022-01-27 NOTE — ED Notes (Signed)
Pt sleeping at present, no distress noted.  Monitoring for safety. 

## 2022-01-27 NOTE — ED Provider Notes (Signed)
[facility] Continuous Assessment Admission H&P ? ?Date: 01/27/22 ?Patient Name: Patrick Baker ?MRN: [id number] ?Chief Complaint:  ?Chief Complaint  ?Patient presents with  ? Paranoid  ?   ? ?Diagnoses:  ?Final diagnoses:  ?Paranoia (HCC)  ? ? ?HPI:  ?41 yo male with history of polysubstance abuse who [facility] via [employer] for concerns for paranoia; he was picked up at a coffee shop. Patient seen in conjunction with TTS counselor.  ? ?Patient is extremely fidgety on exam, he is constantly looking around the room and fidgeting with his hands. TP is disorganized and difficult to follow at times.  He states that the police became involved because "people are trying to kill me". He goes on to states that some individuals broke into the residence he was staying at and that they were trying to murder him. He states that these same individuals tried to kill him in [place] and that they followed him to [place]. He states that they have been following when he started working at the "[employer]" in [place] and that "I am the only reason that they did not get bought out by [employer]".  He is unsure why these individuals are trying to kill him but shares that he has affiliations with drug dealers. He states "they turn to money. Its a conglomerate. Its a hit squad. It is deep". He denies SI/HI/AVH. ? ?Patient states that he left [place] in 2019 to live in [place] so that he could be "with the love of my life". He states that he [place] to [place] in October 2022 due to people in [place] trying to kill him. He states that when he initially moved to [place] that he resided in [place] (reports remote alcohol use) but that he ended up relapsing and had to leave; he states that during this time he had multiple friends who passed away which triggered relapse.  ? ?Patient provided consent to contact cousin [place] [phone]. Called without answer.  ? ? ?Past  Psychiatric History: ?Previous Medication Trials: denied ?Previous Psychiatric Hospitalizations: x1 in [place] when he was "younger". Declined to provide additional details ?Previous Suicide Attempts: denies true attempt but states when he was younger he [place] did not go through with it ?Outpatient psychiatrist: denied ? ?Social History: ?Marital Status: not married ?Source of Income: [employer] ?Housing Status: with a friend ? ? ?Substance Use (with emphasis over the last 12 months) ?Recreational Drugs: "a little but of this and a little bit of that". When asked specifically affirmed occasional meth and cocaine use ?Use of Alcohol:  denied recent use ?Tobacco Use: no ?H/O Complicated Withdrawal: no ? ?Legal History: ?Past Charges/Incarcerations: denied ?Pending charges: denied  ? ?Family Psychiatric History: ?Grandfather with alcohol use ? ? ?PHQ 2-9:    ? ?Total Time spent with patient: 30 minutes ? ?Musculoskeletal  ?Strength & Muscle Tone: within normal limits ?Gait & Station: normal ?Patient leans: N/A ? ?Psychiatric Specialty Exam  ?Presentation ?General Appearance: Appropriate for Environment; Disheveled ? ?Eye Contact:Fair; Other (comment) (intense) ? ?Speech:Clear and Coherent; Normal Rate; Other (comment) (rapid at times) ? ?Speech Volume:Normal ? ?Handedness:No data recorded ? ?Mood and Affect  ?Mood:Anxious ? ?Affect:Appropriate; Congruent (anxious) ? ? ?Thought Process  ?Thought Processes:Disorganized ? ?Descriptions of Associations:Tangential ? ?Orientation:Full (Time, Place and Person) ? ?Thought Content:Tangential; Paranoid Ideation; Rumination (ruminating on people trying to kill him) ? Diagnosis of Schizophrenia or Schizoaffective disorder in past: No ?  ?  Hallucinations:Hallucinations: None ? ?Ideas of Reference:Paranoia; Delusions ? ?Suicidal Thoughts:Suicidal Thoughts: No ? ?Homicidal Thoughts:Homicidal Thoughts: No ? ? ?Sensorium   ?Memory:Immediate Fair; Recent Fair; Remote Fair ? ?Judgment:Poor ? ?Insight:Lacking ? ? ?Executive Functions  ?Concentration:Poor ? ?Attention Span:Poor ? ?Recall:Fair ? ?Fund of Knowledge:Fair ? ?Language:No data recorded ? ?Psychomotor Activity  ?Psychomotor Activity:Psychomotor Activity: Increased (fidgety) ? ? ?Assets  ?Assets:Communication Skills; Desire for Improvement; Physical Health; Resilience ? ? ?Sleep  ?Sleep:Sleep: Fair ? ? ?Nutritional Assessment (For OBS and [facility]BC admissions only) ?Has the patient had a weight loss or gain of 10 pounds or more in the last 3 months?: -- (UTA at this time d/t psychosis) ?Has the patient had a decrease in food intake/or appetite?: -- (UTA at this time d/t psychosis) ?Does the patient have dental problems?: -- (UTA at this time d/t psychosis) ?Does the patient have eating habits or behaviors that may be indicators of an eating disorder including binging or inducing vomiting?: -- (UTA at this time d/t psychosis) ?Has the patient recently lost weight without trying?: -- (UTA at this time d/t psychosis) ?Has the patient been eating poorly because of a decreased appetite?: -- (UTA at this time d/t psychosis) ? ? ? ?Physical Exam ?Constitutional:   ?   Appearance: Normal appearance. He is normal weight.  ?HENT:  ?   Head: Normocephalic and atraumatic.  ?Eyes:  ?   Extraocular Movements: Extraocular movements intact.  ?Pulmonary:  ?   Effort: Pulmonary effort is normal.  ?Neurological:  ?   General: No focal deficit present.  ?   Mental Status: He is alert and oriented to person, place, and time.  ?Psychiatric:     ?   Attention and Perception: Attention and perception normal.     ?   Speech: Speech normal.     ?   Behavior: Behavior normal. Behavior is cooperative.     ?   Thought Content: Thought content normal.  ? ?Review of Systems  ?Constitutional:  Negative for chills and fever.  ?HENT:  Negative for hearing loss.   ?Eyes:  Negative for discharge and redness.   ?Respiratory:  Negative for cough.   ?Cardiovascular:  Negative for chest pain.  ?Gastrointestinal:  Negative for abdominal pain.  ?Musculoskeletal:  Negative for myalgias.  ?Neurological:  Negative for headaches.  ?Psychiatric/Behavioral:  Positive for substance abuse. Negative for hallucinations and suicidal ideas. The patient is nervous/anxious.   ? ?Blood pressure 128/84, pulse 100, temperature 98.2 ?F (36.8 ?C), temperature source Oral, resp. rate 18, SpO2 100 %. There is no height or weight on file to calculate BMI. ? ?Past Psychiatric History: substance use  ? ?Is the patient at risk to self? Yes  ?Has the patient been a risk to self in the past 6 months? No .    ?Has the patient been a risk to self within the distant past? No   ?Is the patient a risk to others? Yes   ?Has the patient been a risk to others in the past 6 months? No   ?Has the patient been a risk to others within the distant past? No  ? ?Past Medical History: No past medical history on file.  ? ?Family History: No family history on file. ? ?Social History:  ?Social History  ? ?Socioeconomic History  ? Marital status: Single  ?  Spouse name: Not on file  ? Number of children: Not on file  ? Years of education: Not on file  ? Highest education level: Not  on file  ?Occupational History  ? Not on file  ?Tobacco Use  ? Smoking status: Not on file  ? Smokeless tobacco: Not on file  ?Substance and Sexual Activity  ? Alcohol use: Not on file  ? Drug use: Not on file  ? Sexual activity: Not on file  ?Other Topics Concern  ? Not on file  ?Social History Narrative  ? Not on file  ? ?Social Determinants of Health  ? ?Financial Resource Strain: Not on file  ?Food Insecurity: Not on file  ?Transportation Needs: Not on file  ?Physical Activity: Not on file  ?Stress: Not on file  ?Social Connections: Not on file  ?Intimate Partner Violence: Not on file  ? ? ?SDOH:  ?SDOH Screenings  ? ?Alcohol Screen: Not on file  ?Depression (PHQ2-9): Not on file  ?Financial  Resource Strain: Not on file  ?Food Insecurity: Not on file  ?Housing: Not on file  ?Physical Activity: Not on file  ?Social Connections: Not on file  ?Stress: Not on file  ?Tobacco Use: Not on file  ?Transportation Needs: N

## 2022-01-27 NOTE — Progress Notes (Signed)
1mg  ativan ordered and given for acute paranoia.  Patient continues to be frightened and hypervigilant believing people are coming for him.  Will monitor. ?

## 2022-01-27 NOTE — ED Triage Notes (Signed)
Pt presents to [facility] escorted by [employer] voluntarily. Pt was picked up at a coffee shop with concerns about paranoia. Per [employer] pt was stating that someone was following him. Pt has to be constantly redirected during triage process. Pt denies SI/HI and AVH.  ?

## 2022-01-27 NOTE — Progress Notes (Signed)
Patient is extremely paranoid. He believes the [place] is coming to get him as they followed him here.  He was hiding in the bathroom in the dark, crouching between the chairs and thinking that hit men have climbed onto the roof and are going to come through the lights.  Patient hypervigilant, not being reassured by attempts to calm him.  Patient laughing and smiling inappropriately.  Patient hyperverbal and tangential with flight od ideas.  He is however, pleasant and cooperative for the most part.  He is convinced he is going to be killed.  RN notified dr Bronwen Betters who entered agitation protocol and zyprex first dose now which was given to patient after several staff members convinced him to take it. Will monitor attempt to reassure patient and maintain safety.  ?

## 2022-01-27 NOTE — ED Notes (Signed)
Patient had Dinner ?
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
## 2022-01-27 NOTE — ED Provider Notes (Signed)
Notified by staff that patient is becoming agitated and expresses concern that there are people on the roof. Reviewed current resulted labs. EKG reviewed- qtc 435.  Ordered one time dose of zyprexa 5 mg to be given now and agitation protocol.  ?

## 2022-01-27 NOTE — ED Notes (Signed)
Patient reports snorting meth prior to admission.  He is twitchy, unable to sit still will rapid speech and thought.  He is tangential and paranoid believing people are after him and have followed him here from [place].  Patient is cautious looking out the windows thinking people can see in.  He was crouched down in between the chairs in order not to be seen.  He was initially resistant to admission however with the help of several staff members he allowed the process to commence.  Patient was resistant to come onto unit believing that there were people who were after him on the unit.  He was eventually convinced to enter and he did so without further issue.   ?

## 2022-01-27 NOTE — ED Notes (Signed)
Pt continues to think people are attempting to enter unit to harm him.  Pt redirected and meds given.  Pt took without incident.  Cooperative at present.  Monitoring for safety. ?

## 2022-01-28 NOTE — ED Notes (Signed)
Pt sitting on the chair beside the pull out bed/chair eating his lunch. Safety maintained and will continue to monitor.  ?

## 2022-01-28 NOTE — ED Notes (Signed)
Pt becoming increasingly agitated stating this is a slaughter house and a holding cell.  Pt faxed out for facilities for admission.  No disposition yet. ?

## 2022-01-28 NOTE — ED Notes (Signed)
Pt sleeping at present, no distress noted.  Monitoring for safety. 

## 2022-01-28 NOTE — Progress Notes (Signed)
Per Armanda Heritage Lee,NP, patient meets criteria for inpatient treatment. There are no available beds at [facility] today, per [facility]. CSW faxed referrals to the following facilities for review: ? ?[facility]  Pending - Request Sent N/A [address] [place] [address] [phone] --  ?[facility]  Pending - Request Sent N/A [address], [place] [address] [phone] --  ?[facility]  Pending - Request Sent N/A [address] [place] [address] [phone] --  ?[facility]  Pending - Request Sent N/A [address] [place] [address] [phone] --  ?[facility]  Pending - Request Sent N/A [address] [place] [address] [phone] --  ?[facility]  Pending - Request Sent N/A [address] [place], [place] [address] [phone] --  ?[facility]  Pending - Request Sent N/A [address] [place] [address] [phone] --  ?[facility]  Pending - Request Sent N/A [address], [place] [address] [phone] --  ?[facility]  Pending - Request Sent N/A [address] [place], [place] [address] [phone] --  ?[facility]  Pending - Request Sent N/A [address] [place] [address] [phone] --  ?[facility]  Pending - Request Sent N/A [address] [place] [address] [phone] --  ?[facility]  Pending - Request Sent N/A [address] [place] [address] [phone] --  ?[facility]  Pending - Request Sent N/A [address] [place] [address] [phone] --  ?[facility]  Pending - Request Sent N/A [address] [place] [address] [phone] --  ?[facility]  Pending - Request Sent N/A [address] [place] [address] [phone] --  ?[facility]  Pending - Request Sent N/A [address] [place] [address] [phone] --  ? ?TTS will continue to seek bed placement. ? ?Crissie Reese, MSW, LCSW-A, LCAS-A ?Phone: [phone] ?Disposition/TOC ? ?

## 2022-01-28 NOTE — ED Notes (Signed)
Pt continues to sleep quietly with little movement. ?
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
## 2022-01-28 NOTE — ED Notes (Signed)
Pt sleeping at present, sedated, no distress noted.  Respirations even & unlabored.  Monitoring for safety. ?

## 2022-01-28 NOTE — ED Notes (Signed)
Pt refused oral night time meds. ?

## 2022-01-28 NOTE — ED Notes (Signed)
Pt was given a sub, chips, and juice for lunch.  
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
## 2022-01-28 NOTE — ED Notes (Signed)
Pt A&O x 4,  looking out of window at present, no distress noted, remains irritable.  Monitoring for safety. ?

## 2022-01-29 NOTE — ED Provider Notes (Signed)
Behavioral Health Progress Note ? ?Date and Time: 01/29/2022 12:29 PM ?Name: Patrick Baker ?MRN:  [id number] ? ?Subjective:   ? ?Pt assessed by NP today. Pt reports anxious mood. Continues to present w/ paranoia, stating that there is a "network" after him, trying to shoot and kill him. Continues to request that he be placed w/ witness protection in [place]. Alternatively, requests that he be discharged to the [facility]. Pt is refusing medication stating that medication is for "crazy people". Psychoeducation provided, although pt continues to refuse. Reports feeling frustrated that he was at this facility on his birthday (which was yesterday).  ? ?Pt is a&ox3. Presents appropriate for environment. Eye contact is fair, intense. Speech is clear and coherent, w/ nml rate and volume. Reported mood is anxious. Affect is congruent. TP is disorganized. Description of associations is circumstantial. TC is positive for delusions and paranoid ideation. Judgment poor. Insight lacking. There is no evidence of internal preoccupation, agitation or aggression at this time.  ? ?Will continue to recommend inpatient admission at this time. ? ?Diagnosis:  ?Final diagnoses:  ?Paranoia (HCC)  ? ?Total Time spent with patient: 15 minutes ? ?Past Psychiatric History: Hx of polysubstance abuse ?Past Medical History: History reviewed. No pertinent past medical history. History reviewed. No pertinent surgical history. ?Family History: History reviewed. No pertinent family history. ?Family Psychiatric  History: Grandfather w/ alcohol use ?Social History:  ?Social History  ? ?Substance and Sexual Activity  ?Alcohol Use None  ?   ?Social History  ? ?Substance and Sexual Activity  ?Drug Use Not on file  ?  ?Social History  ? ?Socioeconomic History  ? Marital status: Single  ?  Spouse name: Not on file  ? Number of children: Not on file  ? Years of education: Not on file  ? Highest education level: Not on file  ?Occupational History  ? Not on  file  ?Tobacco Use  ? Smoking status: Unknown  ? Smokeless tobacco: Not on file  ?Substance and Sexual Activity  ? Alcohol use: Not on file  ? Drug use: Not on file  ? Sexual activity: Not on file  ?Other Topics Concern  ? Not on file  ?Social History Narrative  ? Not on file  ? ?Social Determinants of Health  ? ?Financial Resource Strain: Not on file  ?Food Insecurity: Not on file  ?Transportation Needs: Not on file  ?Physical Activity: Not on file  ?Stress: Not on file  ?Social Connections: Not on file  ? ?SDOH:  ?SDOH Screenings  ? ?Alcohol Screen: Not on file  ?Depression (PHQ2-9): Not on file  ?Financial Resource Strain: Not on file  ?Food Insecurity: Not on file  ?Housing: Not on file  ?Physical Activity: Not on file  ?Social Connections: Not on file  ?Stress: Not on file  ?Tobacco Use: Not on file  ?Transportation Needs: Not on file  ? ?Additional Social History:  ?  ?Pain Medications: See MAR ?Prescriptions: See MAR ?Over the Counter: See MAR ?History of alcohol / drug use?: Yes (Pt states he "uses a little of this and that" pt is paranoid and guarded. Declines to render history) ?Longest period of sobriety (when/how long): UTA ?Negative Consequences of Use:  (UTA) ?Withdrawal Symptoms: None ?  ?  ?  ?  ?  ?  ?  ?  ?  ? ?Sleep: Fair ? ?Appetite:  Fair ? ?Current Medications:  ?Current Facility-Administered Medications  ?Medication Dose Route Frequency Provider Last Rate Last Admin  ? acetaminophen (  TYLENOL) tablet 650 mg  650 mg Oral Q6H PRN Estella Husk, MD      ? alum & mag hydroxide-simeth (MAALOX/MYLANTA) 200-200-20 MG/5ML suspension 30 mL  30 mL Oral Q4H PRN Estella Husk, MD      ? hydrOXYzine (ATARAX) tablet 25 mg  25 mg Oral TID PRN Estella Husk, MD   25 mg at 01/28/22 1459  ? OLANZapine zydis (ZYPREXA) disintegrating tablet 5 mg  5 mg Oral Q8H PRN Estella Husk, MD      ? And  ? LORazepam (ATIVAN) tablet 1 mg  1 mg Oral PRN Estella Husk, MD      ? magnesium  hydroxide (MILK OF MAGNESIA) suspension 30 mL  30 mL Oral Daily PRN Estella Husk, MD      ? OLANZapine zydis (ZYPREXA) disintegrating tablet 5 mg  5 mg Oral QHS Estella Husk, MD      ? traZODone (DESYREL) tablet 50 mg  50 mg Oral QHS PRN Estella Husk, MD      ? ?No current outpatient medications on file.  ? ? ?Labs  ?Lab Results:  ?Admission on 01/27/2022  ?Component Date Value Ref Range Status  ? SARS Coronavirus 2 by RT PCR 01/27/2022 NEGATIVE  NEGATIVE Final  ? Comment: (NOTE) ?SARS-CoV-2 target nucleic acids are NOT DETECTED. ? ?The SARS-CoV-2 RNA is generally detectable in upper respiratory ?specimens during the acute phase of infection. The lowest ?concentration of SARS-CoV-2 viral copies this assay can detect is ?138 copies/mL. A negative result does not preclude SARS-Cov-2 ?infection and should not be used as the sole basis for treatment or ?other patient management decisions. A negative result may occur with  ?improper specimen collection/handling, submission of specimen other ?than nasopharyngeal swab, presence of viral mutation(s) within the ?areas targeted by this assay, and inadequate number of viral ?copies(<138 copies/mL). A negative result must be combined with ?clinical observations, patient history, and epidemiological ?information. The expected result is Negative. ? ?Fact Sheet for Patients:  ?BloggerCourse.com ? ?Fact Sheet for Healthcare Providers:  ?SeriousBroker.it ? ?This test is no  ?                        t yet approved or cleared by the [place] FDA and  ?has been authorized for detection and/or diagnosis of SARS-CoV-2 by ?FDA under an Emergency Use Authorization (EUA). This EUA will remain  ?in effect (meaning this test can be used) for the duration of the ?COVID-19 declaration under Section 564(b)(1) of the Act, 21 ?U.S.C.section 360bbb-3(b)(1), unless the authorization is terminated  ?or revoked sooner.   ? ? ?  ? Influenza A by PCR 01/27/2022 NEGATIVE  NEGATIVE Final  ? Influenza B by PCR 01/27/2022 NEGATIVE  NEGATIVE Final  ? Comment: (NOTE) ?The Xpert Xpress SARS-CoV-2/FLU/RSV plus assay is intended as an aid ?in the diagnosis of influenza from Nasopharyngeal swab specimens and ?should not be used as a sole basis for treatment. Nasal washings and ?aspirates are unacceptable for Xpert Xpress SARS-CoV-2/FLU/RSV ?testing. ? ?Fact Sheet for Patients: ?BloggerCourse.com ? ?Fact Sheet for Healthcare Providers: ?SeriousBroker.it ? ?This test is not yet approved or cleared by the [place] FDA and ?has been authorized for detection and/or diagnosis of SARS-CoV-2 by ?FDA under an Emergency Use Authorization (EUA). This EUA will remain ?in effect (meaning this test can be used) for the duration of the ?COVID-19 declaration under Section 564(b)(1) of the Act, 21 U.S.C. ?section 360bbb-3(b)(1),  unless the authorization is terminated or ?revoked. ? ?Performed at [facility], [address] [place], [place] ?[id number] ?  ? WBC 01/27/2022 10.2  4.0 - 10.5 K/uL Final  ? RBC 01/27/2022 5.87 (H)  4.22 - 5.81 MIL/uL Final  ? Hemoglobin 01/27/2022 13.8  13.0 - 17.0 g/dL Final  ? HCT 60/45/409805/01/2022 43.8  39.0 - 52.0 % Final  ? MCV 01/27/2022 74.6 (L)  80.0 - 100.0 fL Final  ? MCH 01/27/2022 23.5 (L)  26.0 - 34.0 pg Final  ? MCHC 01/27/2022 31.5  30.0 - 36.0 g/dL Final  ? RDW 11/91/478205/01/2022 14.4  11.5 - 15.5 % Final  ? Platelets 01/27/2022 326  150 - 400 K/uL Final  ? nRBC 01/27/2022 0.0  0.0 - 0.2 % Final  ? Neutrophils Relative % 01/27/2022 77  % Final  ? Neutro Abs 01/27/2022 8.0 (H)  1.7 - 7.7 K/uL Final  ? Lymphocytes Relative 01/27/2022 13  % Final  ? Lymphs Abs 01/27/2022 1.3  0.7 - 4.0 K/uL Final  ? Monocytes Relative 01/27/2022 9  % Final  ? Monocytes Absolute 01/27/2022 0.9  0.1 - 1.0 K/uL Final  ? Eosinophils Relative 01/27/2022 0  % Final  ? Eosinophils Absolute  01/27/2022 0.0  0.0 - 0.5 K/uL Final  ? Basophils Relative 01/27/2022 1  % Final  ? Basophils Absolute 01/27/2022 0.1  0.0 - 0.1 K/uL Final  ? Immature Granulocytes 01/27/2022 0  % Final  ? Abs Immature Granulocytes 05/0

## 2022-01-29 NOTE — ED Notes (Signed)
Pt sleeping at present, no distress noted, respirations even & unlabored.  Monitoring for safety. ?

## 2022-01-29 NOTE — ED Notes (Signed)
Pt stated that he wants to return to the [facility]. Informed Pt that he needs to inform the provider on their rounds of this decision. Pt denied SI/HI/AVH. Safety maintained and will continue to monitor.  ?

## 2022-01-29 NOTE — ED Notes (Signed)
Pt sleeping at present, no distress noted.  Monitoring for safety. 

## 2022-01-29 NOTE — ED Notes (Signed)
Pt A&O x4, resting at present,no distress noted.  Monitoring for safety. 

## 2022-01-29 NOTE — ED Notes (Signed)
PT eating a hot meal and 2 juices ?

## 2022-01-29 NOTE — ED Notes (Signed)
PT was given breakfast

## 2022-01-29 NOTE — ED Notes (Signed)
Pt awake and ambulating in the observation area. Breakfast offered and provided. Safety maintained and will continue to monitor.  ?
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
## 2022-01-30 NOTE — ED Notes (Signed)
Pt spoke with provider.  Is currently sitting on side of the bed awaiting dispo.  No  other distress noted.  ?

## 2022-01-30 NOTE — ED Notes (Signed)
Pt sleeping at present, no distress noted.  Monitoring for safety. 

## 2022-01-30 NOTE — Discharge Instructions (Signed)
The suicide prevention education provided includes the following: Suicide risk factors Suicide prevention and interventions National Suicide Hotline telephone number [facility] assessment telephone number [place] Emergency Assistance 911 County and/or Residential Mobile Crisis Unit telephone number   Request made of family/significant other to: Remove weapons (e.g., guns, rifles, knives), all items previously/currently identified as safety concern.   Remove drugs/medications (over the counter, prescriptions, illicit drugs), all items previously/currently identified as a safety concern.    Please contact one of the following facilities to start medication management and therapy services:   [facility] [address] (SECOND FLOOR)  [place]  [address] Phone: [phone]  [facility]  [address] [place], [place] [address] Phone: [phone]  [facility] [place]  [address]  [place], [place] [address]  [facility] [place]  [address]  [place], [place] [address] Phone: [phone]   

## 2022-01-30 NOTE — ED Notes (Signed)
Patient A&O x 4, ambulatory. Patient discharged in no acute distress. Patient denied SI/HI, A/VH upon discharge. Patient verbalized understanding of all discharge instructions explained by staff, to include follow up appointments and safety plan. Pt belongings returned to patient from locker #25 intact. Patient escorted to lobby via staff with bus pass in hand to aid in transporting pt to home. Safety maintained.   ?

## 2022-01-30 NOTE — ED Notes (Signed)
Patient has been given food

## 2022-01-30 NOTE — ED Notes (Signed)
Patients showing signs of irritating due to wanting to be discharged. ?

## 2022-01-30 NOTE — ED Notes (Signed)
Pt currently meeting with provider.  ?

## 2022-01-30 NOTE — ED Notes (Signed)
Pt currently sleeping. Breathing is even and unlabored.   Pt is in view of nurse's station.   Staff will continue to monitor for safety. ?

## 2022-02-13 ENCOUNTER — Emergency Department (HOSPITAL_COMMUNITY): Admission: EM | Admit: 2022-02-13 | Payer: Self-pay | Source: Home / Self Care

## 2022-02-14 ENCOUNTER — Emergency Department (HOSPITAL_COMMUNITY)
Admission: EM | Admit: 2022-02-14 | Discharge: 2022-02-17 | Disposition: A | Discharge status: Home / Self Care | Payer: Medicaid Other | Attending: Emergency Medicine | Admitting: Emergency Medicine

## 2022-02-14 ENCOUNTER — Other Ambulatory Visit: Payer: Self-pay

## 2022-02-14 ENCOUNTER — Encounter (HOSPITAL_COMMUNITY): Payer: Self-pay

## 2022-02-14 DIAGNOSIS — F1510 Other stimulant abuse, uncomplicated: Secondary | ICD-10-CM | POA: Diagnosis present

## 2022-02-14 DIAGNOSIS — Z046 Encounter for general psychiatric examination, requested by authority: Secondary | ICD-10-CM

## 2022-02-14 DIAGNOSIS — F22 Delusional disorders: Secondary | ICD-10-CM

## 2022-02-14 DIAGNOSIS — F1210 Cannabis abuse, uncomplicated: Secondary | ICD-10-CM | POA: Insufficient documentation

## 2022-02-14 DIAGNOSIS — S61210A Laceration without foreign body of right index finger without damage to nail, initial encounter: Secondary | ICD-10-CM | POA: Insufficient documentation

## 2022-02-14 DIAGNOSIS — F419 Anxiety disorder, unspecified: Secondary | ICD-10-CM | POA: Insufficient documentation

## 2022-02-14 DIAGNOSIS — Z20822 Contact with and (suspected) exposure to covid-19: Secondary | ICD-10-CM | POA: Insufficient documentation

## 2022-02-14 DIAGNOSIS — Y900 Blood alcohol level of less than 20 mg/100 ml: Secondary | ICD-10-CM | POA: Insufficient documentation

## 2022-02-14 DIAGNOSIS — S6991XA Unspecified injury of right wrist, hand and finger(s), initial encounter: Secondary | ICD-10-CM

## 2022-02-14 LAB — CBC WITH DIFFERENTIAL/PLATELET
Abs Immature Granulocytes: 0.02 10*3/uL (ref 0.00–0.07)
Basophils Absolute: 0.1 10*3/uL (ref 0.0–0.1)
Basophils Relative: 1 %
Eosinophils Absolute: 0.1 10*3/uL (ref 0.0–0.5)
Eosinophils Relative: 1 %
HCT: 40.9 % (ref 39.0–52.0)
Hemoglobin: 12.6 g/dL — ABNORMAL LOW (ref 13.0–17.0)
Immature Granulocytes: 0 %
Lymphocytes Relative: 21 %
Lymphs Abs: 1.9 10*3/uL (ref 0.7–4.0)
MCH: 23.5 pg — ABNORMAL LOW (ref 26.0–34.0)
MCHC: 30.8 g/dL (ref 30.0–36.0)
MCV: 76.3 fL — ABNORMAL LOW (ref 80.0–100.0)
Monocytes Absolute: 1.1 10*3/uL — ABNORMAL HIGH (ref 0.1–1.0)
Monocytes Relative: 12 %
Neutro Abs: 5.9 10*3/uL (ref 1.7–7.7)
Neutrophils Relative %: 65 %
Platelets: 299 10*3/uL (ref 150–400)
RBC: 5.36 MIL/uL (ref 4.22–5.81)
RDW: 14.6 % (ref 11.5–15.5)
WBC: 9.1 10*3/uL (ref 4.0–10.5)
nRBC: 0 % (ref 0.0–0.2)

## 2022-02-14 LAB — COMPREHENSIVE METABOLIC PANEL
ALT: 27 U/L (ref 0–44)
AST: 56 U/L — ABNORMAL HIGH (ref 15–41)
Albumin: 4.5 g/dL (ref 3.5–5.0)
Alkaline Phosphatase: 61 U/L (ref 38–126)
Anion gap: 7 (ref 5–15)
BUN: 20 mg/dL (ref 6–20)
CO2: 27 mmol/L (ref 22–32)
Calcium: 10.2 mg/dL (ref 8.9–10.3)
Chloride: 107 mmol/L (ref 98–111)
Creatinine, Ser: 1.2 mg/dL (ref 0.61–1.24)
GFR, Estimated: >60 mL/min (ref >60)
Glucose, Bld: 94 mg/dL (ref 70–99)
Potassium: 3.8 mmol/L (ref 3.5–5.1)
Sodium: 141 mmol/L (ref 135–145)
Total Bilirubin: 0.7 mg/dL (ref 0.3–1.2)
Total Protein: 7.5 g/dL (ref 6.5–8.1)

## 2022-02-14 LAB — RESP PANEL BY RT-PCR (FLU A&B, COVID) ARPGX2
Influenza A by PCR: NEGATIVE
Influenza B by PCR: NEGATIVE
SARS Coronavirus 2 by RT PCR: NEGATIVE

## 2022-02-14 LAB — RAPID URINE DRUG SCREEN, HOSP PERFORMED
Amphetamines: POSITIVE — AB
Barbiturates: NOT DETECTED
Benzodiazepines: NOT DETECTED
Cocaine: NOT DETECTED
Opiates: NOT DETECTED
Tetrahydrocannabinol: POSITIVE — AB

## 2022-02-14 LAB — ETHANOL: Alcohol, Ethyl (B): <10 mg/dL (ref <10)

## 2022-02-14 MED ORDER — BACITRACIN ZINC 500 UNIT/GM EX OINT
TOPICAL_OINTMENT | Freq: Two times a day (BID) | CUTANEOUS | Status: AC
  Filled 2022-02-14: qty 0.9

## 2022-02-14 NOTE — ED Notes (Signed)
Pt given meal tray.
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
## 2022-02-14 NOTE — ED Notes (Signed)
Marylene Land called with update.

## 2022-02-14 NOTE — ED Notes (Signed)
Pt was informed of [facility] Policy regarding dressing out into hospital attire, all belongings to be placed into belonging bags and labeled with pt ID. Pt acknowledged these instructions I provided. Pt had no further questions or concerns at this time.  

## 2022-02-14 NOTE — ED Notes (Signed)
Pt given urinal and instructed to provide sample. 
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
## 2022-02-15 MED ORDER — LORAZEPAM 1 MG PO TABS
1.0000 mg | ORAL_TABLET | Freq: Once | ORAL | Status: AC
  Administered 2022-02-15: 1 mg via ORAL
  Filled 2022-02-15 (×2): qty 1
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
## 2022-02-15 NOTE — ED Notes (Signed)
Pt refused to take necklace off due to culture reasons.

## 2022-02-15 NOTE — Progress Notes (Signed)
Patient has been denied by [facility] due to no appropriate beds available. Patient meets BH inpatient criteria per [name], NP. Patient has been faxed out to the following facilities:   [facility]  [address] [place] [address] [phone]  [facility]  [address], [place] [address] [phone]  [facility]  [address], [place] [address] [phone]  [facility]  [address], [place] [address] [phone]  [facility]  [address] [place] [address] [phone]  [facility]  [address] [place], [place] [address] [phone]  [facility]  [address] [place], [place] [address] [phone]  [facility]  [address] [place] [address] [phone]  [facility]  [address] [place] [address] [phone]  [facility]  [address] [place] [address] [phone]  [facility]  [address], [place] [address] [phone]   Damita Dunnings, MSW, LCSW-A  9:48 PM 02/15/2022
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
## 2022-02-15 NOTE — BH Assessment (Signed)
Clinician messaged Elizabeth A. Preyer, RN: "Hey. It's Trey with TTS. Is the pt able to engage in the assessment, if so the pt will need to be placed in a private room. Also is the pt under IVC?" ? ?Clinician awaiting response.  ? ? ?Carlette Palmatier D Jadia Capers, MS, LCMHC, CRC ?Triage Specialist ?[phone] ? ?

## 2022-02-15 NOTE — ED Notes (Signed)
TTS occurring at this time 

## 2022-02-15 NOTE — BH Assessment (Signed)
Clinician attempted to engage the pt in TTS assessment however when she called the cart she received an error message (cannot connect call). Clinician messaged Lenice Pressman, RN about the error message and asked to notified when the cart was up and running.  *If cart continues to have error message clinician to move to next pt in the queue.*    Vertell Novak, MS, [facility], [facility] Triage Specialist [phone]

## 2022-02-15 NOTE — ED Provider Notes (Signed)
Patient wanted to speak to the doctor.  He is alert oriented to person and place.  Does not seem to be aggressive now.  He is waiting for placement Per behavioral health recommendation   Bethann Berkshire, MD 02/15/22 2013

## 2022-02-15 NOTE — Progress Notes (Signed)
Patient has been denied by [facility] due to no beds available. Patient meets BH inpatient criteria per [name], NP. Patient has been faxed out to the following facilities:   [facility]  [address] [place] [address] [phone]  [facility]  [address], [place] [address] [phone]  [facility]  [address] [place] [address] [phone]  [facility]  [address], [place] [address] [phone]  [facility]  [address] [place] [address] [phone]  [facility]  [address] [place], [place] [address] [phone]  [facility]  [address] [place], [place] [address] [phone]  [facility]  [address] [place] [address] [phone]  [facility]  [address] [place] [address] [phone]  [facility]  [address], [place] [address] [phone]  [facility]  [address], [place] [address] [phone]   Damita Dunnings, MSW, LCSW-A  11:11 AM 02/15/2022

## 2022-02-16 DIAGNOSIS — F22 Delusional disorders: Secondary | ICD-10-CM | POA: Diagnosis not present

## 2022-02-16 MED ORDER — OLANZAPINE 10 MG PO TBDP: 10.0000 mg | ORAL_TABLET | Freq: Every day | ORAL | Status: DC

## 2022-02-16 MED ORDER — OLANZAPINE 5 MG PO TBDP
5.0000 mg | ORAL_TABLET | Freq: Every day | ORAL | Status: DC
  Administered 2022-02-16: 5 mg via ORAL
  Filled 2022-02-16: qty 1

## 2022-02-16 MED ORDER — BACITRACIN ZINC 500 UNIT/GM EX OINT
TOPICAL_OINTMENT | Freq: Two times a day (BID) | CUTANEOUS | Status: DC
  Administered 2022-02-16: 1 via TOPICAL
  Filled 2022-02-16: qty 0.9

## 2022-02-16 MED ORDER — ZIPRASIDONE MESYLATE 20 MG IM SOLR: 20.0000 mg | Freq: Once | INTRAMUSCULAR | Status: DC

## 2022-02-16 MED ORDER — OLANZAPINE 10 MG PO TBDP
10.0000 mg | ORAL_TABLET | Freq: Every day | ORAL | Status: DC
  Filled 2022-02-16: qty 1

## 2022-02-16 MED ORDER — LORAZEPAM 1 MG PO TABS
2.0000 mg | ORAL_TABLET | ORAL | Status: AC | PRN
  Administered 2022-02-16: 1 mg via ORAL
  Filled 2022-02-16: qty 2

## 2022-02-16 NOTE — ED Notes (Signed)
Patient is sleeping at this time. We will do VSs when he wakes up.  
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
## 2022-02-16 NOTE — Progress Notes (Signed)
After patient took shower, redressed finger laceration with bacitracin and gauze.
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
## 2022-02-16 NOTE — ED Notes (Signed)
Pt aunt, Orlin Hilding [phone], visited pt. She said to contact her for collateral information.

## 2022-02-16 NOTE — Progress Notes (Signed)
Patient has been denied by [facility] due to no appropriate beds available. Patient meets BH inpatient criteria per [name], NP. Patient has been faxed out to the following facilities:   [facility]  [address] [place] [address] [phone]  [facility]  [address] [place] [address] [phone]  [facility]  [address] [place] [address] [phone]  [facility]  [address], [place] [address] [phone]  [facility]  [address] [place] [address] [phone]  [facility]  [address] [place], [place] [address] [phone]  [facility]  [address] [place], [place] [address] [phone]  [facility]  [address] [place] [address] [phone]  [facility]  [address] [place] [address] [phone]  [facility]  [address] [place] [address] [phone]  [facility]  [address] [place] [address] [phone]   Damita Dunnings, MSW, LCSW-A  2:51 PM 02/16/2022

## 2022-02-16 NOTE — ED Notes (Signed)
Pt standing the the doorway of his room watching TV and saying to the TV, "Fuck you. Cocksucker. Dirtbag."

## 2022-02-16 NOTE — Consult Note (Signed)
Telepsych Consultation   Reason for Consult:  Paranoid and Delusional   Referring Physician:  EDP Location of Patient: [place] Location of Provider: [facility]  Patient Identification: Patrick Baker MRN:  [id number] Principal Diagnosis: Paranoid delusion [facility]) Diagnosis:  Principal Problem:   Paranoid delusion (HCC)   Total Time spent with patient: 15 minutes  Subjective:   Patrick Baker is a 41 y.o. male was observed via tele-assessment.  Patient was was asked to state his "name and birthdate."  Patient became agitated stating that he wanted to see a real doctor in person and removed the tele-assessment cart.  He presented pressured stating people ' are trying to kill me and you know who they are."  RN requests agitation orders.  See chart.  Patient was initiated on Zyprexa 5 mg p.o. nightly.  NP will attempt to reassess at a later time in person.   HPI:  Per admission assessment note: -"Patrick Baker 41 y.o., male patient presented who initially presented to [facility] on 01/27/2022 disorganized and paranoid. He was admitted to the continuous assessment unit while awaiting IP psychiatric bed availability. Patrick Baker, 41 y.o., male patient seen face to face by this provider, consulted with Dr. Bronwen Betters; and chart reviewed on 01/30/22.  Per chart review patient has a history of polysubstance abuse. Upon admission his UDS was positive for amphetamines, methamphetamines, and THC. His BAL was neg."   Past Psychiatric History:   Risk to Self:   Risk to Others:   Prior Inpatient Therapy:   Prior Outpatient Therapy:    Past Medical History: History reviewed. No pertinent past medical history. History reviewed. No pertinent surgical history. Family History: History reviewed. No pertinent family history. Family Psychiatric  History:  Social History:  Social History   Substance and Sexual Activity  Alcohol Use None     Social History   Substance and Sexual Activity   Drug Use Not on file    Social History   Socioeconomic History   Marital status: Single    Spouse name: Not on file   Number of children: Not on file   Years of education: Not on file   Highest education level: Not on file  Occupational History   Not on file  Tobacco Use   Smoking status: Unknown   Smokeless tobacco: Not on file  Substance and Sexual Activity   Alcohol use: Not on file   Drug use: Not on file   Sexual activity: Not on file  Other Topics Concern   Not on file  Social History Narrative   Not on file   Social Determinants of Health   Financial Resource Strain: Not on file  Food Insecurity: Not on file  Transportation Needs: Not on file  Physical Activity: Not on file  Stress: Not on file  Social Connections: Not on file   Additional Social History:    Allergies:  No Known Allergies  Labs:  Results for orders placed or performed during the hospital encounter of 02/14/22 (from the past 48 hour(s))  Resp Panel by RT-PCR (Flu A&B, Covid) Nasopharyngeal Swab     Status: None   Collection Time: 02/14/22  7:23 PM   Specimen: Nasopharyngeal Swab; Nasopharyngeal(NP) swabs in vial transport medium  Result Value Ref Range   SARS Coronavirus 2 by RT PCR NEGATIVE NEGATIVE    Comment: (NOTE) SARS-CoV-2 target nucleic acids are NOT DETECTED.  The SARS-CoV-2 RNA is generally detectable in upper respiratory specimens during the acute phase of infection. The  lowest concentration of SARS-CoV-2 viral copies this assay can detect is 138 copies/mL. A negative result does not preclude SARS-Cov-2 infection and should not be used as the sole basis for treatment or other patient management decisions. A negative result may occur with  improper specimen collection/handling, submission of specimen other than nasopharyngeal swab, presence of viral mutation(s) within the areas targeted by this assay, and inadequate number of viral copies(<138 copies/mL). A negative result  must be combined with clinical observations, patient history, and epidemiological information. The expected result is Negative.  Fact Sheet for Patients:  BloggerCourse.com  Fact Sheet for Healthcare Providers:  SeriousBroker.it  This test is no t yet approved or cleared by the [place] FDA and  has been authorized for detection and/or diagnosis of SARS-CoV-2 by FDA under an Emergency Use Authorization (EUA). This EUA will remain  in effect (meaning this test can be used) for the duration of the COVID-19 declaration under Section 564(b)(1) of the Act, 21 U.S.C.section 360bbb-3(b)(1), unless the authorization is terminated  or revoked sooner.       Influenza A by PCR NEGATIVE NEGATIVE   Influenza B by PCR NEGATIVE NEGATIVE    Comment: (NOTE) The Xpert Xpress SARS-CoV-2/FLU/RSV plus assay is intended as an aid in the diagnosis of influenza from Nasopharyngeal swab specimens and should not be used as a sole basis for treatment. Nasal washings and aspirates are unacceptable for Xpert Xpress SARS-CoV-2/FLU/RSV testing.  Fact Sheet for Patients: BloggerCourse.com  Fact Sheet for Healthcare Providers: SeriousBroker.it  This test is not yet approved or cleared by the [place] FDA and has been authorized for detection and/or diagnosis of SARS-CoV-2 by FDA under an Emergency Use Authorization (EUA). This EUA will remain in effect (meaning this test can be used) for the duration of the COVID-19 declaration under Section 564(b)(1) of the Act, 21 U.S.C. section 360bbb-3(b)(1), unless the authorization is terminated or revoked.  Performed at [facility], [address], [place], [place] [address]   Comprehensive metabolic panel     Status: Abnormal   Collection Time: 02/14/22  7:37 PM  Result Value Ref Range   Sodium 141 135 - 145 mmol/L   Potassium 3.8  3.5 - 5.1 mmol/L   Chloride 107 98 - 111 mmol/L   CO2 27 22 - 32 mmol/L   Glucose, Bld 94 70 - 99 mg/dL    Comment: Glucose reference range applies only to samples taken after fasting for at least 8 hours.   BUN 20 6 - 20 mg/dL   Creatinine, Ser 8.11 0.61 - 1.24 mg/dL   Calcium 57.2 8.9 - 62.0 mg/dL   Total Protein 7.5 6.5 - 8.1 g/dL   Albumin 4.5 3.5 - 5.0 g/dL   AST 56 (H) 15 - 41 U/L   ALT 27 0 - 44 U/L   Alkaline Phosphatase 61 38 - 126 U/L   Total Bilirubin 0.7 0.3 - 1.2 mg/dL   GFR, Estimated >35 >59 mL/min    Comment: (NOTE) Calculated using the CKD-EPI Creatinine Equation (2021)    Anion gap 7 5 - 15    Comment: Performed at [facility], [address], [place], [place] [address]  Ethanol     Status: None   Collection Time: 02/14/22  7:37 PM  Result Value Ref Range   Alcohol, Ethyl (B) <10 <10 mg/dL    Comment: (NOTE) Lowest detectable limit for serum alcohol is 10 mg/dL.  For medical purposes only. Performed at [employer] [facility], [address], [place], [place] [address]   CBC with Diff     Status: Abnormal   Collection Time: 02/14/22  7:37 PM  Result Value Ref Range   WBC 9.1 4.0 - 10.5 K/uL   RBC 5.36 4.22 - 5.81 MIL/uL   Hemoglobin 12.6 (L) 13.0 - 17.0 g/dL   HCT 81.1 91.4 - 78.2 %   MCV 76.3 (L) 80.0 - 100.0 fL   MCH 23.5 (L) 26.0 - 34.0 pg   MCHC 30.8 30.0 - 36.0 g/dL   RDW 95.6 21.3 - 08.6 %   Platelets 299 150 - 400 K/uL   nRBC 0.0 0.0 - 0.2 %   Neutrophils Relative % 65 %   Neutro Abs 5.9 1.7 - 7.7 K/uL   Lymphocytes Relative 21 %   Lymphs Abs 1.9 0.7 - 4.0 K/uL   Monocytes Relative 12 %   Monocytes Absolute 1.1 (H) 0.1 - 1.0 K/uL   Eosinophils Relative 1 %   Eosinophils Absolute 0.1 0.0 - 0.5 K/uL   Basophils Relative 1 %   Basophils Absolute 0.1 0.0 - 0.1 K/uL   Immature Granulocytes 0 %   Abs Immature Granulocytes 0.02 0.00 - 0.07 K/uL    Comment: Performed at [facility], [address], [place], [place] [address]  Urine rapid drug screen (hosp performed)     Status: Abnormal   Collection Time: 02/14/22  8:55 PM  Result Value Ref Range   Opiates NONE DETECTED NONE DETECTED   Cocaine NONE DETECTED NONE DETECTED   Benzodiazepines NONE DETECTED NONE DETECTED   Amphetamines POSITIVE (A) NONE DETECTED   Tetrahydrocannabinol POSITIVE (A) NONE DETECTED   Barbiturates NONE DETECTED NONE DETECTED    Comment: (NOTE) DRUG SCREEN FOR MEDICAL PURPOSES ONLY.  IF CONFIRMATION IS NEEDED FOR ANY PURPOSE, NOTIFY LAB WITHIN 5 DAYS.  LOWEST DETECTABLE LIMITS FOR URINE DRUG SCREEN Drug Class                     Cutoff (ng/mL) Amphetamine and metabolites    1000 Barbiturate and metabolites    200 Benzodiazepine                 200 Tricyclics and metabolites     300 Opiates and metabolites        300 Cocaine and metabolites        300 THC                            50 Performed at [facility], [address], [place], [place] [address]     Medications:  Current Facility-Administered Medications  Medication Dose Route Frequency Provider Last Rate Last Admin   ziprasidone (GEODON) injection 20 mg  20 mg Intramuscular Once Oneta Rack, NP       And   LORazepam (ATIVAN) tablet 2 mg  2 mg Oral PRN Oneta Rack, NP       OLANZapine zydis (ZYPREXA) disintegrating tablet 5 mg  5 mg Oral QHS Oneta Rack, NP       Current Outpatient Medications  Medication Sig Dispense Refill   Acetaminophen (TYLENOL 8 HOUR PO) Take 1 tablet by mouth as needed (PAIN).      Musculoskeletal: Strength & Muscle Tone: within normal limits Gait & Station: normal Patient leans: N/A          Psychiatric Specialty Exam:  Presentation  General  Appearance: Appropriate for Environment  Eye Contact:Good  Speech:Clear and Coherent; Normal Rate  Speech Volume:Normal  Handedness:Right   Mood and Affect  Mood:Anxious  Affect:Congruent   Thought  Process  Thought Processes:Coherent  Descriptions of Associations:Intact  Orientation:Full (Time, Place and Person)  Thought Content:Paranoid Ideation  History of Schizophrenia/Schizoaffective disorder:No  Duration of Psychotic Symptoms:Less than six months  Hallucinations:No data recorded Ideas of Reference:None  Suicidal Thoughts:No data recorded Homicidal Thoughts:No data recorded  Sensorium  Memory:Immediate Good; Recent Good; Remote Good  Judgment:Fair  Insight:Fair   Executive Functions  Concentration:Good  Attention Span:Good  Recall:Good  Fund of Knowledge:Good  Language:Good   Psychomotor Activity  Psychomotor Activity:No data recorded  Assets  Assets:Communication Skills; Desire for Improvement; Financial Resources/Insurance; Physical Health; Resilience; Housing; Social Support   Sleep  Sleep:No data recorded   Physical Exam: Physical Exam Vitals and nursing note reviewed.  Cardiovascular:     Rate and Rhythm: Normal rate and regular rhythm.  Neurological:     Mental Status: He is alert and oriented to person, place, and time.  Psychiatric:        Mood and Affect: Mood normal.        Thought Content: Thought content normal.   Review of Systems  Eyes: Negative.   Cardiovascular: Negative.   Genitourinary: Negative.   Psychiatric/Behavioral:  The patient is nervous/anxious.   All other systems reviewed and are negative. Blood pressure (!) 108/91, pulse (!) 108, temperature (!) 97.3 F (36.3 C), temperature source Oral, resp. rate 16, SpO2 100 %. There is no height or weight on file to calculate BMI.  Treatment Plan Summary: Daily contact with patient to assess and evaluate symptoms and progress in treatment and Medication management  Disposition: Recommend psychiatric Inpatient admission when medically cleared.  This service was provided via telemedicine using a 2-way, interactive audio and video technology.  Names of all persons  participating in this telemedicine service and their role in this encounter. Name: Patrick SimmondsMichael Baker  Role: patient   Name: Patrick Baker  Role: NP           Oneta Rackanika N Farin Buhman, NP 02/16/2022 10:21 AM

## 2022-02-16 NOTE — ED Provider Notes (Signed)
Emergency Medicine Observation Re-evaluation Note  Patrick Baker is a 41 y.o. male, seen on rounds today.  Pt initially presented to the ED for complaints of Ingestion and Finger Injury Currently, the patient is calm, but periods of feeling agitated and having paranoid and grandiose delusions.   Physical Exam  BP 122/65 (BP Location: Right Arm)   Pulse 100   Temp 97.8 F (36.6 C) (Oral)   Resp 17   SpO2 100%  Physical Exam General: alert, calm, conversant.  Cardiac: regular rate. Lungs: breathing comfortably.  Psych: calm, cooperative. Pt with grandiose, paranoid delusions - indicating had to leave [place] and other prominent officials were out to get him, but now is having similar issues here. Denies SI/HI.   ED Course / MDM    I have reviewed the labs performed to date as well as medications administered while in observation.  Recent changes in the last 24 hours include ED obs, BH assessment.   Plan   Patrick Baker is under involuntary commitment.   Patient is awaiting inpatient psych placement.   Disposition per [facility] team.    Lajean Saver, MD 02/16/22 [phone]

## 2022-02-16 NOTE — ED Notes (Signed)
Pt spoke to his cousin [place] at 10:00 AM this morning. Chad's wife Andrey Cota [phone] visited pt for 4 minutes then pt told her to leave. Archie Patten said "this behavior" has been going on since pt left [place] six months ago. Archie Patten said the pt told her that he does "Ice." Archie Patten said the pt does not have a mental health diagnosis.

## 2022-02-17 DIAGNOSIS — F1510 Other stimulant abuse, uncomplicated: Secondary | ICD-10-CM | POA: Diagnosis present

## 2022-02-17 NOTE — ED Notes (Signed)
Patient is asleep. We will do VSs when he wakes up.

## 2022-02-17 NOTE — ED Provider Notes (Signed)
  Physical Exam  BP 127/80 (BP Location: Right Arm)   Pulse (!) 51   Temp 97.8 F (36.6 C) (Oral)   Resp 20   SpO2 100%   Physical Exam  Procedures  Procedures  ED Course / MDM    Medical Decision Making Amount and/or Complexity of Data Reviewed Labs: ordered.  Risk OTC drugs.   Patient excepted at [facility].  Reevaluated.  Still appears belligerent.  Stated "what the fuck are you looking at".  Appears stable to transfer with police.       Davonna Belling, MD 02/17/22 [phone]

## 2022-02-17 NOTE — ED Notes (Signed)
Pt woke up at 12:50 PM. Pt given lunch tray. Pt said, "These people are fucking fucking me." Gave pt phone to call people who left messages for him. At 1:17 PM, pt spoke with someone on the phone in his room. Pt yelled, cursed, and said things such as, "This isn't a fucking hospital." Pt hung up the phone. Pt declined zyprexa at 1:46 PM and said, "I don't need it. I don't want it."

## 2022-02-17 NOTE — Progress Notes (Signed)
Pt was accepted to [facility] unit 02/17/2022 anytime-Intake is waiting on Fax conformation of IVC Paper work and Medical Clearance note.  Pt meets inpatient criteria per  [name], NP  Attending Physician will be Novella Rob, MD  Report can be called to: [phone]  Pt can arrive anytime on 02/17/22  Care Team notified: Sherian Rein, RN, Gaetano Net, RN.   Kelton Pillar, LCSWA 02/17/2022 @ 3:13 PM

## 2022-02-17 NOTE — ED Notes (Addendum)
Report given to Vickie at [facility]

## 2022-02-17 NOTE — Progress Notes (Signed)
Pt under review at [facility]  CSW revived a phone call from [facility] advising pt is under review pending the following to be presented to their provider: IVC paper work and medical clearance note to be faxed to [phone]. CSW communicated with nursing Sherian Rein, RN who agreed to fax the requested information.   Maryjean Ka, MSW, [facility] 02/17/2022 2:59 PM

## 2022-02-17 NOTE — ED Notes (Signed)
Sheriff called and transportation organized 

## 2022-02-17 NOTE — Consult Note (Signed)
Telepsych Consultation   Reason for Consult:  IVC Referring Physician:  Tegeler, Canary Brim, MD Location of Patient: [name] Location of Provider: Behavioral Health TTS Department  Patient Identification: Patrick Baker MRN:  [id number] Principal Diagnosis: Paranoid delusion [facility]) Diagnosis:  Principal Problem:   Paranoid delusion (HCC)   Total Time spent with patient: 20 minutes 1030: RN reported pt remains asleep at this time  Subjective:   Patrick Baker is a 41 y.o. male patient admitted with involuntary commitment.  Patient presents irritable and labile. Confrontational. Refused to ask orientation questions;"I have people trying to kill me and I'm the one behind bars. It's pretty messed up don't you think."  Demanding to see a Clinical research associate. Demanding to leave. States he hasn't been using meth very long; minimal insight. He denies any thoughts of wanting to harm himself or others. Remains delusional, paranoid, and tangential. Refusing medications. He denies any auditory or visual hallucinations; has delusional thought content. Minimal insight. Refusing to answer majority of assessment questions.   Per EDRN note 02/17/22 1430: "Pt woke up at 12:50 PM. Pt given lunch tray. Pt said, "These people are fucking fucking me." Gave pt phone to call people who left messages for him. At 1:17 PM, pt spoke with someone on the phone in his room. Pt yelled, cursed, and said things such as, "This isn't a fucking hospital." Pt hung up the phone. Pt declined zyprexa at 1:46 PM and said, "I don't need it. I don't want it."  HPI:  Patrick Baker is a 41 year old male patient who presented [facility] after being picked up in the streetby [employer]. Patient was noted to be paranoid; cousins reported patient's drug use (methamphetamine) was affecting his current mental state. While in ED patient continued to make paranoid statements of people being after him to "kill" him and the mayor of [place] "trying to find"  him. Family later IVC'd patient. UDS+amphetamine, BAL<10.   Past Psychiatric History: amphetamine abuse, paranoia, delusions  Risk to Self:  yes Risk to Others:  yes Prior Inpatient Therapy:   Prior Outpatient Therapy:    Past Medical History: History reviewed. No pertinent past medical history. History reviewed. No pertinent surgical history. Family History: History reviewed. No pertinent family history. Family Psychiatric  History: not noted Social History:  Social History   Substance and Sexual Activity  Alcohol Use None     Social History   Substance and Sexual Activity  Drug Use Not on file    Social History   Socioeconomic History   Marital status: Single    Spouse name: Not on file   Number of children: Not on file   Years of education: Not on file   Highest education level: Not on file  Occupational History   Not on file  Tobacco Use   Smoking status: Unknown   Smokeless tobacco: Not on file  Substance and Sexual Activity   Alcohol use: Not on file   Drug use: Not on file   Sexual activity: Not on file  Other Topics Concern   Not on file  Social History Narrative   Not on file   Social Determinants of Health   Financial Resource Strain: Not on file  Food Insecurity: Not on file  Transportation Needs: Not on file  Physical Activity: Not on file  Stress: Not on file  Social Connections: Not on file   Additional Social History:    Allergies:  No Known Allergies  Labs: No results found for this  or any previous visit (from the past 48 hour(s)).  Medications:  Current Facility-Administered Medications  Medication Dose Route Frequency Provider Last Rate Last Admin   bacitracin ointment   Topical BID Lorre NickAllen, Anthony, MD   1 application. at 02/16/22 2037   OLANZapine zydis (ZYPREXA) disintegrating tablet 10 mg  10 mg Oral Daily Ntuen, Jesusita Okaina C, FNP       OLANZapine zydis (ZYPREXA) disintegrating tablet 5 mg  5 mg Oral QHS Oneta RackLewis, Tanika N, NP   5 mg at  02/16/22 2038   ziprasidone (GEODON) injection 20 mg  20 mg Intramuscular Once Oneta RackLewis, Tanika N, NP       Current Outpatient Medications  Medication Sig Dispense Refill   Acetaminophen (TYLENOL 8 HOUR PO) Take 1 tablet by mouth as needed (PAIN).      Musculoskeletal: Strength & Muscle Tone: increased Gait & Station: normal Patient leans: N/A  Psychiatric Specialty Exam:  Presentation  General Appearance: Appropriate for Environment  Eye Contact:Good  Speech:Clear and Coherent; Normal Rate  Speech Volume:Normal  Handedness:Right  Mood and Affect  Mood:Anxious  Affect:Congruent  Thought Process  Thought Processes:Coherent  Descriptions of Associations:Intact  Orientation:Full (Time, Place and Person)  Thought Content:Paranoid Ideation  History of Schizophrenia/Schizoaffective disorder:No  Duration of Psychotic Symptoms:Less than six months  Hallucinations:No data recorded Ideas of Reference:None  Suicidal Thoughts:No data recorded Homicidal Thoughts:No data recorded  Sensorium  Memory:Immediate Good; Recent Good; Remote Good  Judgment:Fair  Insight:Fair  Executive Functions  Concentration:Good  Attention Span:Good  Recall:Good  Fund of Knowledge:Good  Language:Good  Psychomotor Activity  Psychomotor Activity:No data recorded  Assets  Assets:Communication Skills; Desire for Improvement; Financial Resources/Insurance; Physical Health; Resilience; Housing; Social Support  Sleep  Sleep:No data recorded  Physical Exam: Physical Exam Vitals and nursing note reviewed.  HENT:     Head: Normocephalic.     Nose: Nose normal.     Mouth/Throat:     Mouth: Mucous membranes are moist.     Pharynx: Oropharynx is clear.  Eyes:     Pupils: Pupils are equal, round, and reactive to light.  Cardiovascular:     Rate and Rhythm: Normal rate.     Pulses: Normal pulses.  Pulmonary:     Effort: Pulmonary effort is normal.  Abdominal:     General:  Abdomen is flat.  Musculoskeletal:        General: Normal range of motion.     Cervical back: Normal range of motion.  Skin:    General: Skin is warm and dry.  Neurological:     Mental Status: He is alert and oriented to person, place, and time.  Psychiatric:        Attention and Perception: He is inattentive.        Mood and Affect: Affect is labile, blunt and angry.        Speech: Speech is rapid and pressured and tangential.        Behavior: Behavior is agitated and combative.        Thought Content: Thought content is paranoid and delusional. Thought content does not include homicidal or suicidal ideation. Thought content does not include homicidal or suicidal plan.        Judgment: Judgment is impulsive and inappropriate.   Review of Systems  Psychiatric/Behavioral:  Positive for substance abuse.   All other systems reviewed and are negative. Blood pressure 92/64, pulse 65, temperature (!) 97.3 F (36.3 C), resp. rate 18, SpO2 100 %. There is no height or weight  on file to calculate BMI.  Treatment Plan Summary: Daily contact with patient to assess and evaluate symptoms and progress in treatment, Medication management, and Plan inpatient hospitalization; [facility] accepted to [facility] to be transported 02/17/22  Disposition: Recommend psychiatric Inpatient admission when medically cleared. Supportive therapy provided about ongoing stressors. Discussed crisis plan, support from social network, calling 911, coming to the Emergency Department, and calling Suicide Hotline.  This service was provided via telemedicine using a 2-way, interactive audio and video technology.  Names of all persons participating in this telemedicine service and their role in this encounter. Name: Maxie Barb Role: PMHNP  Name: Nelly Rout Role: Attending MD  Name: [name] Role: patient  Name:  Role:     Loletta Parish, NP 02/17/2022 10:40 AM

## 2022-02-18 ENCOUNTER — Telehealth (HOSPITAL_COMMUNITY): Payer: Self-pay | Admitting: Emergency Medicine

## 2022-02-18 NOTE — BH Assessment (Signed)
Care Management - [facility] Follow Up Discharges   Writer attempted to make contact with patient today and was unsuccessful.  Phone just rang, no voicemail.   Per chart review, patient was provided with outpatient resources.
